# Patient Record
Sex: Female | Born: 1955
Health system: Southern US, Community
[De-identification: ages and names within clinical notes are randomized; demographics above are authoritative.]

## PROBLEM LIST (undated history)

## (undated) DIAGNOSIS — H4311 Vitreous hemorrhage, right eye: Secondary | ICD-10-CM

## (undated) DIAGNOSIS — N183 Chronic kidney disease, stage 3 unspecified: Secondary | ICD-10-CM

## (undated) DIAGNOSIS — I701 Atherosclerosis of renal artery: Secondary | ICD-10-CM

## (undated) DIAGNOSIS — N289 Disorder of kidney and ureter, unspecified: Secondary | ICD-10-CM

## (undated) DIAGNOSIS — E119 Type 2 diabetes mellitus without complications: Secondary | ICD-10-CM

## (undated) DIAGNOSIS — H409 Unspecified glaucoma: Secondary | ICD-10-CM

## (undated) DIAGNOSIS — M2550 Pain in unspecified joint: Secondary | ICD-10-CM

## (undated) DIAGNOSIS — I10 Essential (primary) hypertension: Secondary | ICD-10-CM

## (undated) DIAGNOSIS — H547 Unspecified visual loss: Secondary | ICD-10-CM

## (undated) DIAGNOSIS — Z961 Presence of intraocular lens: Secondary | ICD-10-CM

## (undated) DIAGNOSIS — E785 Hyperlipidemia, unspecified: Secondary | ICD-10-CM

## (undated) HISTORY — PX: CARDIAC CATHETERIZATION: SHX172

## (undated) HISTORY — PX: EYE SURGERY: SHX253

## (undated) HISTORY — PX: RENAL ARTERY STENT: SHX2321

## (undated) HISTORY — DX: Vitreous hemorrhage, right eye: H43.11

## (undated) HISTORY — DX: Type 2 diabetes mellitus without complications: E11.9

## (undated) HISTORY — PX: CATARACT EXTRACTION: SUR2

## (undated) HISTORY — PX: TONSILLECTOMY: SUR1361

## (undated) HISTORY — PX: CAROTID ENDARTERECTOMY: SUR193

## (undated) HISTORY — DX: Atherosclerosis of renal artery: I70.1

## (undated) HISTORY — DX: Unspecified glaucoma: H40.9

## (undated) HISTORY — DX: Presence of intraocular lens: Z96.1

---

## 2010-10-04 ENCOUNTER — Encounter: Admission: RE | Admit: 2010-10-04 | Discharge: 2010-10-04 | Payer: Self-pay | Admitting: Cardiology

## 2010-10-05 ENCOUNTER — Encounter: Admission: RE | Admit: 2010-10-05 | Discharge: 2010-10-05 | Payer: Self-pay | Admitting: Cardiology

## 2010-10-23 ENCOUNTER — Ambulatory Visit: Payer: Self-pay | Admitting: Vascular Surgery

## 2010-11-05 ENCOUNTER — Inpatient Hospital Stay (HOSPITAL_COMMUNITY)
Admission: RE | Admit: 2010-11-05 | Discharge: 2010-11-06 | Payer: Self-pay | Source: Home / Self Care | Attending: Vascular Surgery | Admitting: Vascular Surgery

## 2010-11-05 ENCOUNTER — Encounter: Payer: Self-pay | Admitting: Vascular Surgery

## 2010-11-27 ENCOUNTER — Ambulatory Visit
Admission: RE | Admit: 2010-11-27 | Discharge: 2010-11-27 | Payer: Self-pay | Source: Home / Self Care | Attending: Vascular Surgery | Admitting: Vascular Surgery

## 2010-11-29 ENCOUNTER — Observation Stay (HOSPITAL_COMMUNITY)
Admission: AD | Admit: 2010-11-29 | Discharge: 2010-11-30 | Payer: Self-pay | Source: Home / Self Care | Attending: Cardiology | Admitting: Cardiology

## 2010-11-29 LAB — GLUCOSE, CAPILLARY
Glucose-Capillary: 109 mg/dL — ABNORMAL HIGH (ref 70–99)
Glucose-Capillary: 136 mg/dL — ABNORMAL HIGH (ref 70–99)
Glucose-Capillary: 142 mg/dL — ABNORMAL HIGH (ref 70–99)
Glucose-Capillary: 384 mg/dL — ABNORMAL HIGH (ref 70–99)
Glucose-Capillary: 136 mg/dL — ABNORMAL HIGH (ref 70–99)
Glucose-Capillary: 255 mg/dL — ABNORMAL HIGH (ref 70–99)

## 2010-11-29 LAB — CBC
HCT: 34.2 % — ABNORMAL LOW (ref 36.0–46.0)
Hemoglobin: 11.3 g/dL — ABNORMAL LOW (ref 12.0–15.0)
MCH: 29.7 pg (ref 26.0–34.0)
MCHC: 33 g/dL (ref 30.0–36.0)
MCV: 89.8 fL (ref 78.0–100.0)
Platelets: 328 10*3/uL (ref 150–400)
RBC: 3.81 MIL/uL — ABNORMAL LOW (ref 3.87–5.11)
RDW: 13 % (ref 11.5–15.5)
WBC: 5.9 10*3/uL (ref 4.0–10.5)

## 2010-11-29 LAB — PROTIME-INR
INR: 0.89 (ref 0.00–1.49)
Prothrombin Time: 12.2 seconds (ref 11.6–15.2)

## 2010-11-30 DIAGNOSIS — I701 Atherosclerosis of renal artery: Secondary | ICD-10-CM

## 2010-11-30 LAB — CBC
HCT: 32.3 % — ABNORMAL LOW (ref 36.0–46.0)
Hemoglobin: 10.5 g/dL — ABNORMAL LOW (ref 12.0–15.0)
MCH: 29.3 pg (ref 26.0–34.0)
MCHC: 32.5 g/dL (ref 30.0–36.0)
MCV: 90.2 fL (ref 78.0–100.0)
Platelets: 290 10*3/uL (ref 150–400)
RBC: 3.58 MIL/uL — ABNORMAL LOW (ref 3.87–5.11)
RDW: 13.1 % (ref 11.5–15.5)
WBC: 5.3 10*3/uL (ref 4.0–10.5)

## 2010-11-30 LAB — BASIC METABOLIC PANEL
BUN: 13 mg/dL (ref 6–23)
CO2: 25 mEq/L (ref 19–32)
Calcium: 10.1 mg/dL (ref 8.4–10.5)
Chloride: 103 mEq/L (ref 96–112)
Creatinine, Ser: 0.72 mg/dL (ref 0.4–1.2)
GFR calc Af Amer: >60 mL/min (ref >60)
GFR calc non Af Amer: >60 mL/min (ref >60)
Glucose, Bld: 131 mg/dL — ABNORMAL HIGH (ref 70–99)
Potassium: 3.9 mEq/L (ref 3.5–5.1)
Sodium: 140 mEq/L (ref 135–145)

## 2010-11-30 LAB — GLUCOSE, CAPILLARY: Glucose-Capillary: 174 mg/dL — ABNORMAL HIGH (ref 70–99)

## 2011-02-05 LAB — COMPREHENSIVE METABOLIC PANEL
ALT: 24 U/L (ref 0–35)
AST: 30 U/L (ref 0–37)
Albumin: 4 g/dL (ref 3.5–5.2)
Alkaline Phosphatase: 75 U/L (ref 39–117)
BUN: 25 mg/dL — ABNORMAL HIGH (ref 6–23)
CO2: 31 mEq/L (ref 19–32)
Calcium: 10.3 mg/dL (ref 8.4–10.5)
Chloride: 103 mEq/L (ref 96–112)
Creatinine, Ser: 0.83 mg/dL (ref 0.4–1.2)
GFR calc Af Amer: >60 mL/min (ref >60)
GFR calc non Af Amer: >60 mL/min (ref >60)
Glucose, Bld: 75 mg/dL (ref 70–99)
Potassium: 4.7 mEq/L (ref 3.5–5.1)
Sodium: 140 mEq/L (ref 135–145)
Total Bilirubin: 0.5 mg/dL (ref 0.3–1.2)
Total Protein: 7.2 g/dL (ref 6.0–8.3)

## 2011-02-05 LAB — PROTIME-INR
INR: 0.97 (ref 0.00–1.49)
Prothrombin Time: 13.1 seconds (ref 11.6–15.2)

## 2011-02-05 LAB — CBC
HCT: 29 % — ABNORMAL LOW (ref 36.0–46.0)
HCT: 35.1 % — ABNORMAL LOW (ref 36.0–46.0)
Hemoglobin: 11.5 g/dL — ABNORMAL LOW (ref 12.0–15.0)
Hemoglobin: 9.6 g/dL — ABNORMAL LOW (ref 12.0–15.0)
MCH: 29.2 pg (ref 26.0–34.0)
MCH: 29.3 pg (ref 26.0–34.0)
MCHC: 32.8 g/dL (ref 30.0–36.0)
MCHC: 33.1 g/dL (ref 30.0–36.0)
MCV: 88.4 fL (ref 78.0–100.0)
MCV: 89.1 fL (ref 78.0–100.0)
Platelets: 278 10*3/uL (ref 150–400)
RBC: 3.94 MIL/uL (ref 3.87–5.11)
RDW: 12.5 % (ref 11.5–15.5)
WBC: 7.9 10*3/uL (ref 4.0–10.5)

## 2011-02-05 LAB — URINALYSIS, ROUTINE W REFLEX MICROSCOPIC
Bilirubin Urine: NEGATIVE
Glucose, UA: NEGATIVE mg/dL
Hgb urine dipstick: NEGATIVE
Ketones, ur: NEGATIVE mg/dL
Nitrite: NEGATIVE
Protein, ur: NEGATIVE mg/dL
Specific Gravity, Urine: 1.017 (ref 1.005–1.030)
Urobilinogen, UA: 1 mg/dL (ref 0.0–1.0)
pH: 7 (ref 5.0–8.0)

## 2011-02-05 LAB — GLUCOSE, CAPILLARY
Glucose-Capillary: 115 mg/dL — ABNORMAL HIGH (ref 70–99)
Glucose-Capillary: 170 mg/dL — ABNORMAL HIGH (ref 70–99)
Glucose-Capillary: 210 mg/dL — ABNORMAL HIGH (ref 70–99)
Glucose-Capillary: 235 mg/dL — ABNORMAL HIGH (ref 70–99)
Glucose-Capillary: 49 mg/dL — ABNORMAL LOW (ref 70–99)

## 2011-02-05 LAB — ABO/RH: ABO/RH(D): B NEG

## 2011-02-05 LAB — TYPE AND SCREEN
ABO/RH(D): B NEG
Antibody Screen: NEGATIVE

## 2011-02-05 LAB — BASIC METABOLIC PANEL
CO2: 28 mEq/L (ref 19–32)
Glucose, Bld: 159 mg/dL — ABNORMAL HIGH (ref 70–99)
Potassium: 3.6 mEq/L (ref 3.5–5.1)
Sodium: 134 mEq/L — ABNORMAL LOW (ref 135–145)

## 2011-02-05 LAB — SURGICAL PCR SCREEN
MRSA, PCR: NEGATIVE
Staphylococcus aureus: NEGATIVE

## 2011-02-05 LAB — APTT: aPTT: 25 seconds (ref 24–37)

## 2011-04-09 NOTE — Assessment & Plan Note (Signed)
OFFICE VISIT   Andrea Oconnell, Andrea Oconnell  DOB:  May 24, 1956                                       11/27/2010  CHART#:[id number]   Patient presents today for follow-up after right carotid endarterectomy  on 11/05/10.  She did well and was discharged home on postoperative day  #1.  She has mild numbness around the incision and no incisional  soreness.  She does have the usual thickening from the healing ridge.  Her incision is well-healed.  She has had no neurologic deficits.   She informs me that Dr. Jacinto Halim has requested to perform her carotid  duplex in 6 months' follow-up.  I feel that this is fine.  I will see  her in 6 months for final follow-up to assure complete healing.  She  will call us should she have any neurologic deficits.     Larina Earthly, M.D.  Electronically Signed   TFE/MEDQ  D:  11/27/2010  T:  11/27/2010  Job:  [id number]   cc:   Cristy Hilts. Jacinto Halim, MD

## 2011-04-09 NOTE — Consult Note (Signed)
NEW PATIENT CONSULTATION   Oconnell, Andrea Oconnell  DOB:  1956-07-16                                       10/23/2010  CHART#:[id number]   Andrea Oconnell Oconnell presents today for evaluation of severe asymptomatic right  internal carotid artery stenosis.  She is a very pleasant 55 year old  black female who was found by Dr. Jacinto Halim on physical examination to have  a right carotid bruit.  She underwent a carotid duplex on 10/27 showing  severe right internal carotid artery stenosis and subsequently underwent  a CT angiogram at [facility] on October 04, 2010 showing again  severe stenosis of her right internal carotid artery.  She has had  occlusion of the right vertebral artery and very minimal disease in her  left vertebral and carotid artery.  She specifically denies any prior  amaurosis fugax, transient ischemic attack or stroke.  She does not have  any history of cardiac disease.  She does have history of elevated blood  pressure, elevated cholesterol and non-insulin diabetes.   PAST SURGICAL HISTORY:  None.   SOCIAL HISTORY:  She is single with 2 children.  She works in  housekeeping at [employer].  She does not smoke,  having smoked in the remote past.  Does not drink alcohol.   FAMILY HISTORY:  Negative for premature atherosclerotic disease.   REVIEW OF SYSTEMS:  No weight loss or gain.  She is 127 pounds.  She is  5 feet 7 inches tall.  Vascular, cardiac, GI is negative.  NEUROLOGIC:  Positive for headache.  PULMONARY:  Positive for bronchitis.  Hematology, GU is negative.  ENT:  Does report some change in her eyesight.  Musculoskeletal, psychiatric and skin are negative.   PHYSICAL EXAMINATION:  Well-developed, well-nourished black female  appearing her stated age in no acute distress.  Blood pressure is 159/85 right arm, 170/82 left arm, heart rate 59,  respirations 16.  She is in no acute distress.  HEENT:  Normal.  She has a harsh right  carotid bruit and a soft left carotid bruit.  Her  radial pulses are 2+.  She has 2+ femoral, popliteal and 2+ posterior  tibial pulses.  HEART:  Regular rate and rhythm without murmur.  ABDOMEN:  Soft, nontender.  I do not feel any masses.  MUSCULOSKELETAL:  Shows no major deformities or cyanosis.  NEUROLOGIC:  No focal weakness or paresthesias.  SKIN:  Without ulcers or rashes.   I have reviewed her ultrasound and CT with [name] and her cousin  present.  I explained the significance of her critical stenosis in the  right internal carotid artery and have recommended endarterectomy for  reduction of stroke risk.  I explained the procedure including the  expected one 1 day hospitalization.  I did explain the 1-2% risk of  stroke with surgery.  She understands and wishes to proceed with  surgery.  We have scheduled this at her convenience on 12/12 at [facility].  She will notify us should she develop any neurologic  deficits in the interim.     Larina Earthly, M.D.  Electronically Signed   TFE/MEDQ  D:  10/23/2010  T:  10/24/2010  Job:  [id number]   cc:   Jackie Plum, M.D.

## 2011-04-09 NOTE — Procedures (Signed)
NAMEMAIZE, BRITTINGHAM NO.:  [id number]   MEDICAL RECORD NO.:  [id number]          PATIENT TYPE:  INP   LOCATION:  6523                         FACILITY:  [facility]   PHYSICIAN:  Cristy Hilts. Jacinto Halim, MD       DATE OF BIRTH:  Feb 16, 1956   DATE OF PROCEDURE:  11/29/2010  DATE OF DISCHARGE:                    PERIPHERAL VASCULAR INVASIVE PROCEDURE    PROCEDURE PERFORMED:  1. Abdominal aortogram.  2. Selective right and left renal arteriography.  3. Percutaneous transluminal angioplasty and balloon angioplasty of      the inferior right renal artery with a 5.5 x 20 mm Aviator balloon.   INDICATIONS:  Ms. Leslie Jester is a 55 year old hypertensive diabetic  female who has difficult to control hypertension and presently on  greater than 4 drugs.  In spite of this, her systolic blood pressure is  greater than 200, diastolic blood pressure greater than 100 to 110 mmHg.  Given this, she had undergone renal duplex evaluation.  She had  undergone abdominal CT angiogram and this had revealed fibromuscular  dysplasia for right renal artery and normal left renal artery.  Given  this, she was now brought to the catheterization lab for definitive  diagnosis of fibromuscular dysplasia and possible angioplasty of the  same.   Abdominal aortogram.  Abdominal aortogram revealed smooth abdominal  aorta without any abdominal aortic aneurysm.  Aortoiliac bifurcation was  widely patent.  There were 2 renal arteries of the right and 2 renal  arteries of the left, which were almost equal; however, the inferior  pole renal arteries were much larger than the superior pole bilaterally.   Surprisingly, there was only one right renal inferior pole artery that  showed fibromuscular dysplasia appearance of beading.   HEMODYNAMIC DATA:  A careful 4-French end-hole catheter pullback was  performed across the FMD lesion.  There was about 20-25 mm pressure  gradient across the FMD documented.   INTERVENTION DATA:  Successful balloon angioplasty of the right inferior  renal artery FMD with a 5.5 x 20 mm Aviator balloon at a peak of 8  atmospheric pressure for 30 seconds followed by angiography.  Excellent  results were noted.   HEMODYNAMIC DATA:  Postangioplasty, careful 4-French indwelling catheter  pullback was again performed after the angioplasty to confirm adequacy  of angioplasty.  This did confirm adequate angioplasty without any  residual pressure gradient across the FMD.  Hence, the pressure was  deemed successful at the catheter and the guidewires were withdrawn out  of the body.   TECHNIQUE OF THE PROCEDURE:  Under sterile precautions, using a 6-French  right femoral arterial access, a 5-French short LIMA catheter was  advanced into the abdominal aorta and abdominal aortogram was performed.  Selective right and left renal arteriography was performed only at the  inferior pole arteries and the superior pole arteries appeared to be  totally normal.  There was beaded appearance evident at the right renal  artery.  Given heparin for anticoagulation maintaining ACT greater than  200, a Stabilizer guidewire was advanced into right renal artery.  A 4-  French end-hole catheter was  then advanced into the FMD and careful  pullback was performed with documentation of pressure gradient.  This  was followed by utilization of a 6-French LIMA guide catheter and  engagement of the inferior renal pole artery and performing the  angioplasty over the Stabilizer guidewire with utilization of a 5.5 x 20  mm Aviator balloon.  One inflation was performed at a peak of 8  atmospheric pressure for a total of 30 seconds.  The patient did have  mild loin pain during the angioplasty, hence the pressure was slowly  withdrawn and then angiography was performed.  Excellent results were  noted.  This was confirmed with a pullback, 4-French catheter  utilization for hemodynamic monitoring followed  by withdrawal of the  guidewire and guide catheter were outside of the body.  Right femoral  arteriography was performed through the arterial access sheath and the  access was closed with ProGlide Perclose with excellent hemostasis.  The  patient tolerated the procedure.  There was no immediate complication  noted.      Cristy Hilts. Jacinto Halim, MD      JRG/MEDQ  D:  11/29/2010  T:  11/29/2010  Job:  [id number]   cc:   Jackie Plum, M.D.   Electronically Signed by Yates Decamp MD on 12/01/2010 10:25:16 AM

## 2011-06-04 ENCOUNTER — Other Ambulatory Visit (INDEPENDENT_AMBULATORY_CARE_PROVIDER_SITE_OTHER): Payer: Commercial Managed Care - PPO

## 2011-06-04 ENCOUNTER — Ambulatory Visit (INDEPENDENT_AMBULATORY_CARE_PROVIDER_SITE_OTHER): Payer: Commercial Managed Care - PPO | Admitting: Vascular Surgery

## 2011-06-04 DIAGNOSIS — Z48812 Encounter for surgical aftercare following surgery on the circulatory system: Secondary | ICD-10-CM

## 2011-06-04 DIAGNOSIS — I6529 Occlusion and stenosis of unspecified carotid artery: Secondary | ICD-10-CM

## 2011-06-04 NOTE — Assessment & Plan Note (Signed)
OFFICE VISIT  AIYA, KEACH DOB:  [birth year]                                       06/04/2011 CHART#:[id number]  Patient presents today for follow-up of her right carotid endarterectomy, Dacron patch angioplasty on 11/05/10.  She continues to do quite well.  She denies any new cardiac or neurologic difficulties. She continues to be a nonsmoker.  She does have diabetes, hypertension, and elevated cholesterol.  Review of systems is positive only for bronchitis other than her history of present illness.  PHYSICAL EXAMINATION:  A well-developed and well-nourished black female appearing stated age in no acute stress.  Blood pressure is 120/70 right arm, 110/64 left arm.  Heart rate is 59, respirations 14.  HEENT: Normal.  Her right neck incision is well-healed without tenderness.  She does have 2+ radial pulses bilaterally.  She is grossly intact neurologically.  She underwent repeat carotid duplex in our office.  This reveals widely patent endarterectomy with no evidence of stenosis and no evidence of stenosis in her contralateral left carotid system.  I am quite pleased with patient's result.  Plan to see her again in 1 year with continued duplex follow-up.    Larina Earthly, M.D. Electronically Signed  TFE/MEDQ  D:  06/04/2011  T:  06/04/2011  Job:  [id number]  cc:   Pamella Pert, MD

## 2011-06-12 NOTE — Procedures (Unsigned)
CAROTID DUPLEX EXAM  INDICATION:  Followup carotid stenosis.  HISTORY: Diabetes:  Yes. Cardiac:  No. Hypertension:  Yes. Smoking:  No. Previous Surgery:  Right carotid endarterectomy 11/05/2010. CV History:  Asymptomatic. Amaurosis Fugax No, Paresthesias No, Hemiparesis No                                      RIGHT             LEFT Brachial systolic pressure:         148               142 Brachial Doppler waveforms:         WNL               WNL Vertebral direction of flow:        Antegrade/blunted Antegrade DUPLEX VELOCITIES (cm/sec) CCA peak systolic                   67                104 ECA peak systolic                   109               86 ICA peak systolic                   65                79 ICA end diastolic                   28                27 PLAQUE MORPHOLOGY:                  Heterogeneous     Heterogeneous PLAQUE AMOUNT:                      Mild              Mild PLAQUE LOCATION:                    CCA/ECA           CCA/ICA  IMPRESSION: 1. Right internal carotid artery patent with history of endarterectomy     on 11/05/2010. 2. Left internal carotid artery stenosis in the 1%-39% range. 3. Abnormal blunted low velocity flow in the right vertebral artery     with normal multiphasic Doppler signals in the right subclavian and     innominate artery. 4. Left vertebral artery is patent and antegrade.  ___________________________________________ Larina Earthly, M.D.  SH/MEDQ  D:  06/04/2011  T:  06/04/2011  Job:  [id number]

## 2011-09-11 DIAGNOSIS — I6529 Occlusion and stenosis of unspecified carotid artery: Secondary | ICD-10-CM

## 2011-12-12 ENCOUNTER — Other Ambulatory Visit: Payer: Commercial Managed Care - PPO

## 2011-12-12 ENCOUNTER — Other Ambulatory Visit: Payer: Medicaid Other

## 2012-01-09 ENCOUNTER — Other Ambulatory Visit (INDEPENDENT_AMBULATORY_CARE_PROVIDER_SITE_OTHER): Payer: Medicaid Other | Admitting: *Deleted

## 2012-01-09 DIAGNOSIS — Z48812 Encounter for surgical aftercare following surgery on the circulatory system: Secondary | ICD-10-CM

## 2012-01-09 DIAGNOSIS — I6529 Occlusion and stenosis of unspecified carotid artery: Secondary | ICD-10-CM

## 2012-01-15 ENCOUNTER — Encounter: Payer: Self-pay | Admitting: Vascular Surgery

## 2012-01-15 NOTE — Procedures (Unsigned)
CAROTID DUPLEX EXAM  INDICATION:  Followup carotid endarterectomy.  HISTORY: Diabetes:  Yes Cardiac:  No Hypertension:  Yes Smoking:  No Previous Surgery:  Right carotid endarterectomy 11/05/2010 CV History:  Currently asymptomatic Amaurosis Fugax No, Paresthesias No, Hemiparesis No                                      RIGHT             LEFT Brachial systolic pressure:         143               145 Brachial Doppler waveforms:         Normal            Normal Vertebral direction of flow:        Antegrade-tardus parvus             Antegrade DUPLEX VELOCITIES (cm/sec) CCA peak systolic                   56                92 ECA peak systolic                   175               75 ICA peak systolic                   73                91 ICA end diastolic                   22                32 PLAQUE MORPHOLOGY:                  Heterogenous      Heterogenous PLAQUE AMOUNT:                      Minimal           Minimal PLAQUE LOCATION:                    CCA, ECA          CCA, ICA  IMPRESSION:  Patent right carotid endarterectomy site with no evidence of restenosis.  Left ICA velocity suggests 1% to 39% stenosis.  Abnormal right vertebral artery flow.  Stable in comparison to the previous exam.  ___________________________________________ Larina Earthly, M.D.  EM/MEDQ  D:  01/10/2012  T:  01/10/2012  Job:  [id number]

## 2012-02-16 ENCOUNTER — Encounter (HOSPITAL_BASED_OUTPATIENT_CLINIC_OR_DEPARTMENT_OTHER): Payer: Self-pay

## 2012-02-16 ENCOUNTER — Emergency Department (INDEPENDENT_AMBULATORY_CARE_PROVIDER_SITE_OTHER): Payer: Medicaid Other

## 2012-02-16 ENCOUNTER — Other Ambulatory Visit: Payer: Self-pay

## 2012-02-16 ENCOUNTER — Emergency Department (HOSPITAL_BASED_OUTPATIENT_CLINIC_OR_DEPARTMENT_OTHER)
Admission: EM | Admit: 2012-02-16 | Discharge: 2012-02-16 | Disposition: A | Discharge status: Home / Self Care | Payer: Medicaid Other | Attending: Emergency Medicine | Admitting: Emergency Medicine

## 2012-02-16 DIAGNOSIS — M25519 Pain in unspecified shoulder: Secondary | ICD-10-CM | POA: Insufficient documentation

## 2012-02-16 DIAGNOSIS — M79609 Pain in unspecified limb: Secondary | ICD-10-CM

## 2012-02-16 DIAGNOSIS — R9431 Abnormal electrocardiogram [ECG] [EKG]: Secondary | ICD-10-CM | POA: Insufficient documentation

## 2012-02-16 DIAGNOSIS — I10 Essential (primary) hypertension: Secondary | ICD-10-CM | POA: Insufficient documentation

## 2012-02-16 DIAGNOSIS — M79603 Pain in arm, unspecified: Secondary | ICD-10-CM

## 2012-02-16 DIAGNOSIS — M542 Cervicalgia: Secondary | ICD-10-CM

## 2012-02-16 DIAGNOSIS — M436 Torticollis: Secondary | ICD-10-CM

## 2012-02-16 DIAGNOSIS — E119 Type 2 diabetes mellitus without complications: Secondary | ICD-10-CM | POA: Insufficient documentation

## 2012-02-16 DIAGNOSIS — Z7982 Long term (current) use of aspirin: Secondary | ICD-10-CM | POA: Insufficient documentation

## 2012-02-16 DIAGNOSIS — M47812 Spondylosis without myelopathy or radiculopathy, cervical region: Secondary | ICD-10-CM

## 2012-02-16 DIAGNOSIS — Z794 Long term (current) use of insulin: Secondary | ICD-10-CM | POA: Insufficient documentation

## 2012-02-16 DIAGNOSIS — Z79899 Other long term (current) drug therapy: Secondary | ICD-10-CM | POA: Insufficient documentation

## 2012-02-16 DIAGNOSIS — E785 Hyperlipidemia, unspecified: Secondary | ICD-10-CM | POA: Insufficient documentation

## 2012-02-16 HISTORY — DX: Essential (primary) hypertension: I10

## 2012-02-16 HISTORY — DX: Hyperlipidemia, unspecified: E78.5

## 2012-02-16 LAB — DIFFERENTIAL
Eosinophils Relative: 4 % (ref 0–5)
Lymphocytes Relative: 35 % (ref 12–46)
Lymphs Abs: 2 10*3/uL (ref 0.7–4.0)
Monocytes Absolute: 0.6 10*3/uL (ref 0.1–1.0)

## 2012-02-16 LAB — CBC
HCT: 38.1 % (ref 36.0–46.0)
Hemoglobin: 12.9 g/dL (ref 12.0–15.0)
MCV: 85.6 fL (ref 78.0–100.0)
Platelets: 281 10*3/uL (ref 150–400)
RBC: 4.45 MIL/uL (ref 3.87–5.11)
WBC: 5.9 10*3/uL (ref 4.0–10.5)

## 2012-02-16 LAB — CK TOTAL AND CKMB (NOT AT ARMC): CK, MB: 2 ng/mL (ref 0.3–4.0)

## 2012-02-16 LAB — BASIC METABOLIC PANEL
CO2: 28 mEq/L (ref 19–32)
Calcium: 11.1 mg/dL — ABNORMAL HIGH (ref 8.4–10.5)
Glucose, Bld: 316 mg/dL — ABNORMAL HIGH (ref 70–99)
Sodium: 137 mEq/L (ref 135–145)

## 2012-02-16 MED ORDER — CYCLOBENZAPRINE HCL 10 MG PO TABS
5.0000 mg | ORAL_TABLET | Freq: Once | ORAL | Status: AC
  Administered 2012-02-16: 5 mg via ORAL
  Filled 2012-02-16: qty 1

## 2012-02-16 MED ORDER — OXYCODONE-ACETAMINOPHEN 5-325 MG PO TABS
2.0000 | ORAL_TABLET | Freq: Once | ORAL | Status: AC
  Administered 2012-02-16: 2 via ORAL
  Filled 2012-02-16: qty 2

## 2012-02-16 MED ORDER — SODIUM CHLORIDE 0.9 % IV BOLUS (SEPSIS)
1000.0000 mL | Freq: Once | INTRAVENOUS | Status: AC
  Administered 2012-02-16: 1000 mL via INTRAVENOUS

## 2012-02-16 MED ORDER — NITROGLYCERIN 0.4 MG SL SUBL
0.4000 mg | SUBLINGUAL_TABLET | SUBLINGUAL | Status: DC | PRN
  Administered 2012-02-16 (×2): 0.4 mg via SUBLINGUAL
  Filled 2012-02-16: qty 25

## 2012-02-16 MED ORDER — HYDROMORPHONE HCL PF 1 MG/ML IJ SOLN
1.0000 mg | Freq: Once | INTRAMUSCULAR | Status: AC
  Administered 2012-02-16: 1 mg via INTRAVENOUS
  Filled 2012-02-16: qty 1

## 2012-02-16 MED ORDER — SODIUM CHLORIDE 0.9 % IV SOLN
INTRAVENOUS | Status: DC
  Administered 2012-02-16: 11:00:00 via INTRAVENOUS

## 2012-02-16 NOTE — ED Notes (Signed)
Pt is hypotensive.  IVF increases.  Pt skin warm and dry.

## 2012-02-16 NOTE — Discharge Instructions (Signed)
Please call Dr. Verl Dicker office at 8 8:15 in the morning to be rechecked tomorrow. Please return if you're worse at any time.

## 2012-02-16 NOTE — ED Notes (Signed)
EKG done and shown to Dr. Rosalia Hammers. Old EKG pulled from the muse system and also shown to the doctor.

## 2012-02-16 NOTE — ED Provider Notes (Addendum)
History     CSN: [id number]  Arrival date & time 02/16/12  1191   First MD Initiated Contact with Patient 02/16/12 [phone]      Chief Complaint  Patient presents with  . Shoulder Pain  . Torticollis    (Consider location/radiation/quality/duration/timing/severity/associated sxs/prior treatment) HPI Patient presents complaining of left upper arm pain which began yesterday. She states that it waxed and waned yesterday and is worse today. She has not had any known injury. She points to the upper arm area. She states it is worse when she twists her head. She denies neck pain, chest pain, cough, shortness of breath, leg swelling, or trauma. She has not had similar symptoms in the past. Pain is approximately 5/10 currently. Past Medical History  Diagnosis Date  . Diabetes mellitus   . Hypertension   . Hyperlipidemia     Past Surgical History  Procedure Date  . Carotid endarterectomy   . Renal artery stent   . Cataract extraction     No family history on file.  History  Substance Use Topics  . Smoking status: Never Smoker   . Smokeless tobacco: Not on file  . Alcohol Use: No    OB History    Grav Para Term Preterm Abortions TAB SAB Ect Mult Living                  Review of Systems  All other systems reviewed and are negative.    Allergies  Review of patient's allergies indicates no known allergies.  Home Medications   Current Outpatient Rx  Name Route Sig Dispense Refill  . AMLODIPINE BESYLATE 10 MG PO TABS Oral Take 10 mg by mouth daily.    . ASPIRIN 325 MG PO TABS Oral Take 325 mg by mouth daily.    Marland Kitchen GLIPIZIDE-METFORMIN HCL 5-500 MG PO TABS Oral Take 1 tablet by mouth 2 (two) times daily before a meal.    . HYDROCHLOROTHIAZIDE 25 MG PO TABS Oral Take 25 mg by mouth daily.    . INSULIN GLARGINE 100 UNIT/ML [place] SOLN Subcutaneous Inject 15 Units into the skin at bedtime.    . MULTIVITAMINS PO CAPS Oral Take 1 capsule by mouth daily.    . NEBIVOLOL HCL 10 MG PO  TABS Oral Take 10 mg by mouth daily.    Marland Kitchen ROSUVASTATIN CALCIUM 20 MG PO TABS Oral Take 20 mg by mouth at bedtime.      BP 131/67  Pulse 62  Temp(Src) 98.3 F (36.8 C) (Oral)  Resp 16  Ht 5\' 7"  (1.702 m)  Wt 140 lb (63.504 kg)  BMI 21.93 kg/m2  SpO2 99%  Physical Exam  Nursing note and vitals reviewed. Constitutional: She appears well-developed and well-nourished.  HENT:  Head: Normocephalic and atraumatic.  Eyes: Conjunctivae and EOM are normal. Pupils are equal, round, and reactive to light.  Neck: Normal range of motion. Neck supple.  Cardiovascular: Normal rate, regular rhythm, normal heart sounds and intact distal pulses.   Pulmonary/Chest: Effort normal and breath sounds normal.       No tenderness on palpation.  Abdominal: Soft. Bowel sounds are normal.  Musculoskeletal: Normal range of motion.       No tenderness on palpation or with shoulder movement. Full active range of motion of neck, shoulder elbow, and wrist. No pain with movement. Radial pulses are intact and sensation is intact throughout left upper extremity.  Neurological: She is alert.  Skin: Skin is warm and dry.  Psychiatric: She  has a normal mood and affect. Thought content normal.    ED Course  Procedures (including critical care time)  Labs Reviewed  BASIC METABOLIC PANEL - Abnormal; Notable for the following:    Glucose, Bld 316 (*)    BUN 28 (*)    Calcium 11.1 (*)    GFR calc non Af Amer 80 (*)    All other components within normal limits  TROPONIN I  CBC  DIFFERENTIAL  CK TOTAL AND CKMB  TROPONIN I   Dg Chest 2 View  02/16/2012  *RADIOLOGY REPORT*  Clinical Data: Left neck, shoulder, and arm pain.  CHEST - 2 VIEW  Comparison: None.  Findings: Midline trachea.  Normal heart size and mediastinal contours. No pleural effusion or pneumothorax.  Clear right lung. Probable nipple shadow projecting over the left lung base.  IMPRESSION:  1. No acute cardiopulmonary disease. 2.  Probable left sided  nipple shadow.  Repeat frontal film with nipple markers could confirm.  Original Report Authenticated By: Consuello Bossier, M.D.   Dg Cerv Spine Flex&ext Only  02/16/2012  *RADIOLOGY REPORT*  Clinical Data: Neck and shoulder pain without trauma.  CERVICAL SPINE - FLEXION AND EXTENSION VIEWS ONLY  Comparison: The CT of 10/04/2010  Findings: Lateral flexion and lateral extension views.  Lateral flexion view images through the C7 level.  Limited range of motion. Prevertebral soft tissues are within normal limits.  Maintenance of vertebral body height.  Spondylosis at C5-C6 and C6-C7.  The extension view also demonstrates limited range of motion.  No instability across this limited range of motion.  Vertebral body height is maintained.  The extension view images through C7 as well.  IMPRESSION: Lower cervical spondylosis.  Extremely limited range of motion. Maintenance of vertebral body heights.  Original Report Authenticated By: Consuello Bossier, M.D.   Dg Shoulder Left  02/16/2012  *RADIOLOGY REPORT*  Clinical Data: Onset of left neck and shoulder pain.  No trauma.  LEFT SHOULDER - 2+ VIEW  Comparison: None.  Findings: Minimal acromioclavicular joint osteoarthritis. No acute fracture or dislocation.  Visualized portions of the left hemithorax are within normal limits.  IMPRESSION: No acute osseous abnormality.  Original Report Authenticated By: Consuello Bossier, M.D.     No diagnosis found.   Date: 02/16/2012  Rate: 566  Rhythm: sinus tachycardia  QRS Axis: normal  Intervals: normal  ST/T Wave abnormalities: nonspecific ST/T changes  Conduction Disutrbances:anterior q waves  Narrative Interpretation:   Old EKG Reviewed: changes noted  Results for orders placed during the hospital encounter of 02/16/12  TROPONIN I      Component Value Range   Troponin I <0.30  <0.30 (ng/mL)  CBC      Component Value Range   WBC 5.9  4.0 - 10.5 (K/uL)   RBC 4.45  3.87 - 5.11 (MIL/uL)   Hemoglobin 12.9  12.0 -  15.0 (g/dL)   HCT 11.9  14.7 - 82.9 (%)   MCV 85.6  78.0 - 100.0 (fL)   MCH 29.0  26.0 - 34.0 (pg)   MCHC 33.9  30.0 - 36.0 (g/dL)   RDW 56.2  13.0 - 86.5 (%)   Platelets 281  150 - 400 (K/uL)  DIFFERENTIAL      Component Value Range   Neutrophils Relative 52  43 - 77 (%)   Neutro Abs 3.0  1.7 - 7.7 (K/uL)   Lymphocytes Relative 35  12 - 46 (%)   Lymphs Abs 2.0  0.7 - 4.0 (  K/uL)   Monocytes Relative 9  3 - 12 (%)   Monocytes Absolute 0.6  0.1 - 1.0 (K/uL)   Eosinophils Relative 4  0 - 5 (%)   Eosinophils Absolute 0.2  0.0 - 0.7 (K/uL)   Basophils Relative 1  0 - 1 (%)   Basophils Absolute 0.0  0.0 - 0.1 (K/uL)  BASIC METABOLIC PANEL      Component Value Range   Sodium 137  135 - 145 (mEq/L)   Potassium 3.9  3.5 - 5.1 (mEq/L)   Chloride 97  96 - 112 (mEq/L)   CO2 28  19 - 32 (mEq/L)   Glucose, Bld 316 (*) 70 - 99 (mg/dL)   BUN 28 (*) 6 - 23 (mg/dL)   Creatinine, Ser 4.40  0.50 - 1.10 (mg/dL)   Calcium 10.2 (*) 8.4 - 10.5 (mg/dL)   GFR calc non Af Amer 80 (*) >90 (mL/min)   GFR calc Af Amer >90  >90 (mL/min)     MDM  Patient given nitroglycerin x2 here. Pain decreased to 3/10 after first nitroglycerin but then increased to 8/10 after second nitroglycerin. She is given IV Dilaudid and pain has decreased again to 3/10. First troponin is negative. Second troponin and CK-MB are pending. Patient's care discussed with Dr. Jacinto Halim  he feels given ration of patient's symptoms, a second set of enzymes can be obtained and if these are negative, she can be seen in followup by him tomorrow. I have discussed this with the patient. She is in agreement. She will call his office at 8 AM tomorrow to be rechecked tomorrow. She understands the pain changes or mesenteric chest she should be reevaluated immediately.    Hilario Quarry, MD 02/16/12 1407  Patient had been discharged. I was called in to room to speak with family member. Family member states that she has "never seen somebody discharged  with an abnormal EKG." Family member states that patient is very weak. Patient states that she feels fine and thinks that maybe the pain medicine. Vital signs are rechecked and patient continues hypertensive. Discussed with patient and family member other possibilities of arm pain including radiculopathy. Discussed with patient and family member that new symptoms could certainly change plans. Patient states that she wants to go home and is comfortable following up with Dr. Jacinto Halim in the morning.  Hilario Quarry, MD 02/16/12 [phone]

## 2012-02-16 NOTE — ED Notes (Signed)
Patient transported to X-ray 

## 2012-02-16 NOTE — ED Notes (Signed)
Pt reports onset of left sided neck, shoulder and arm pain that started yesterday.  Denies injury.

## 2012-02-16 NOTE — ED Notes (Signed)
Pt transported to and from radiology. Family at bedside.

## 2013-01-14 ENCOUNTER — Ambulatory Visit: Payer: Medicaid Other | Admitting: Neurosurgery

## 2013-01-14 ENCOUNTER — Other Ambulatory Visit: Payer: Medicaid Other

## 2013-01-15 ENCOUNTER — Other Ambulatory Visit: Payer: Medicaid Other

## 2013-07-09 IMAGING — CR DG SHOULDER 2+V*L*
3 series · 3 of 3 positions shown · non-contrast
Comparison: None.

CLINICAL DATA: Onset of left neck and shoulder pain.  No trauma.

LEFT SHOULDER - 2+ VIEW

[w shoulder ap internal left]
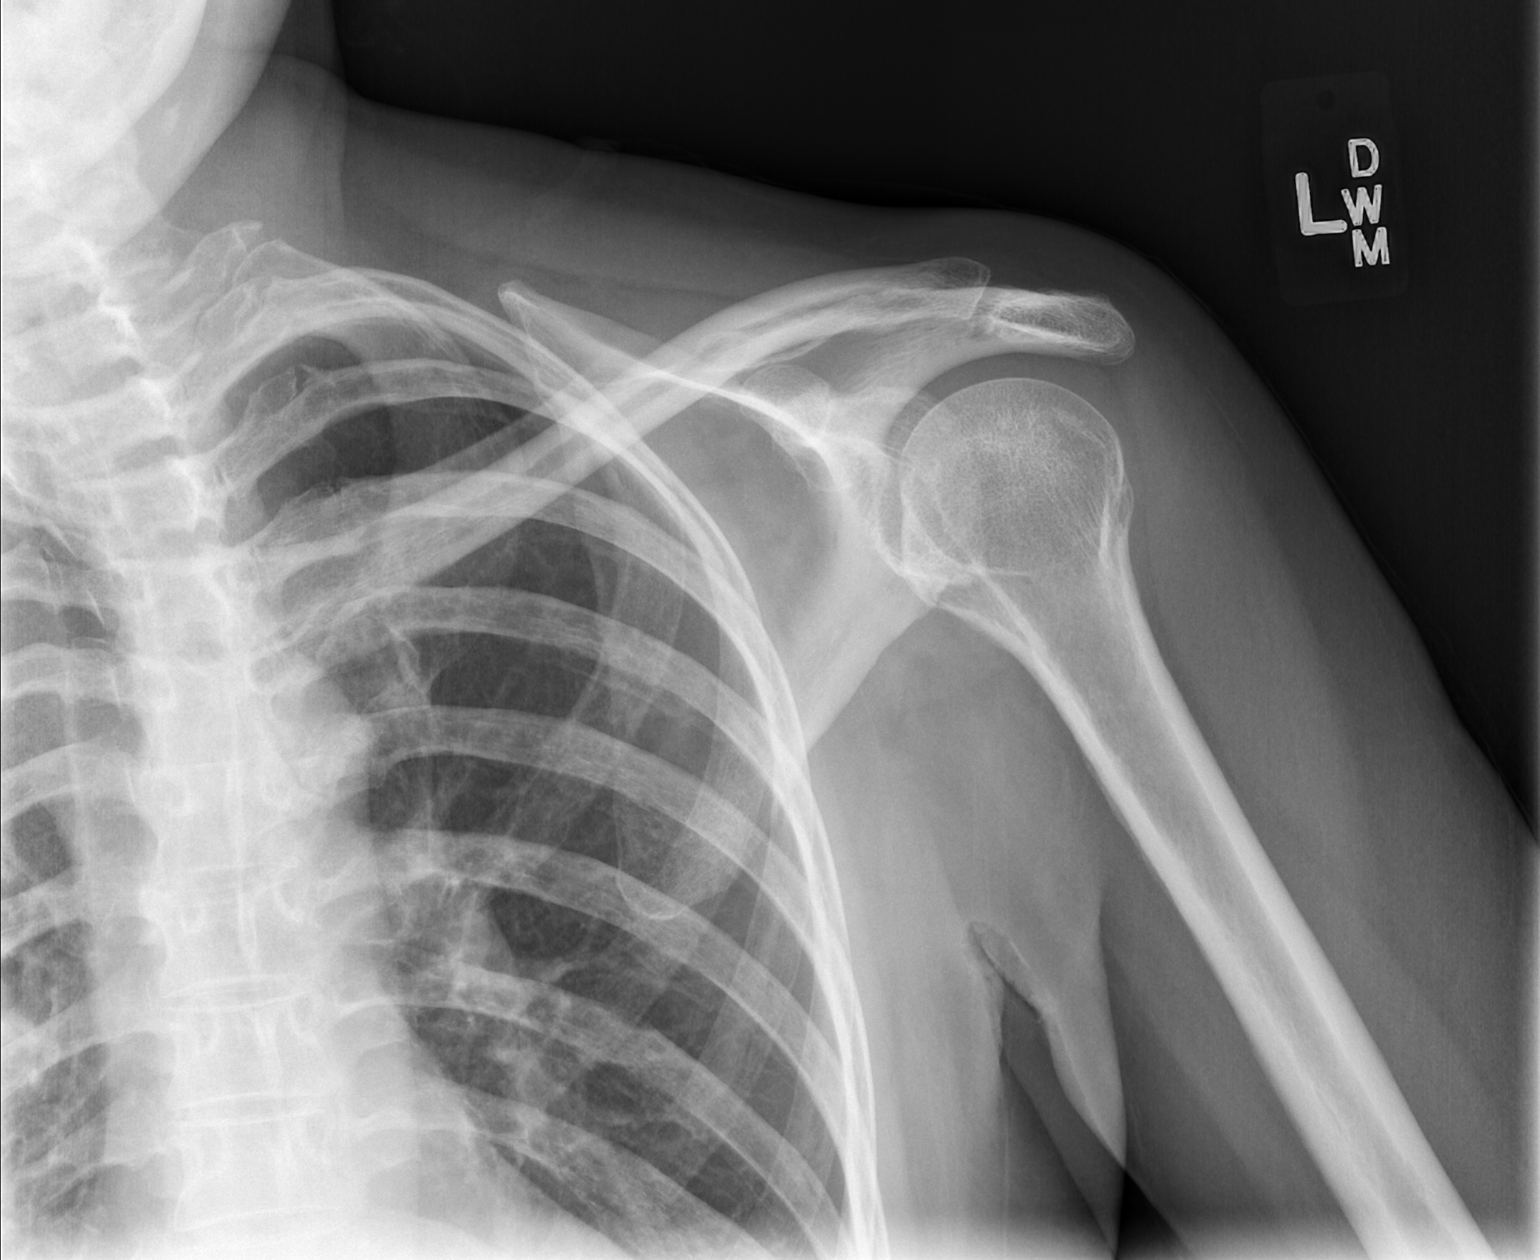

[w shoulder ap external left]
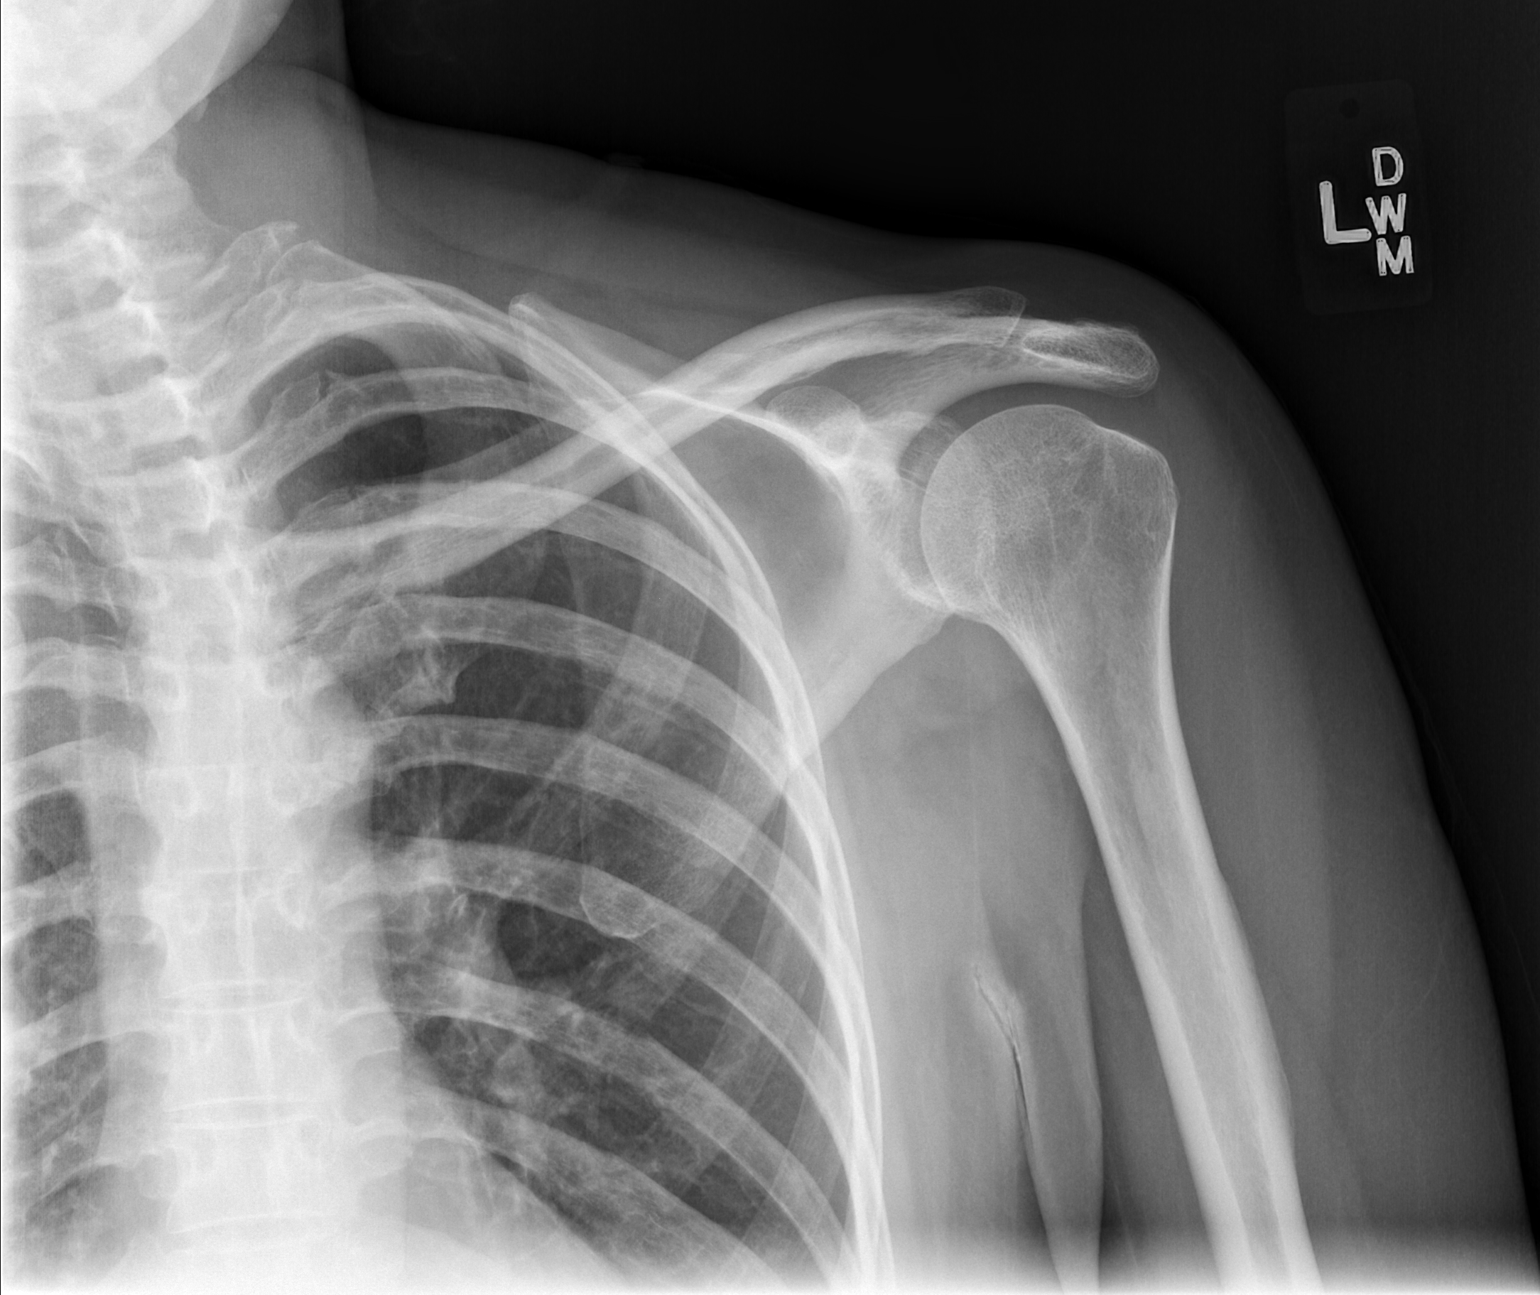

[w shoulder y view left]
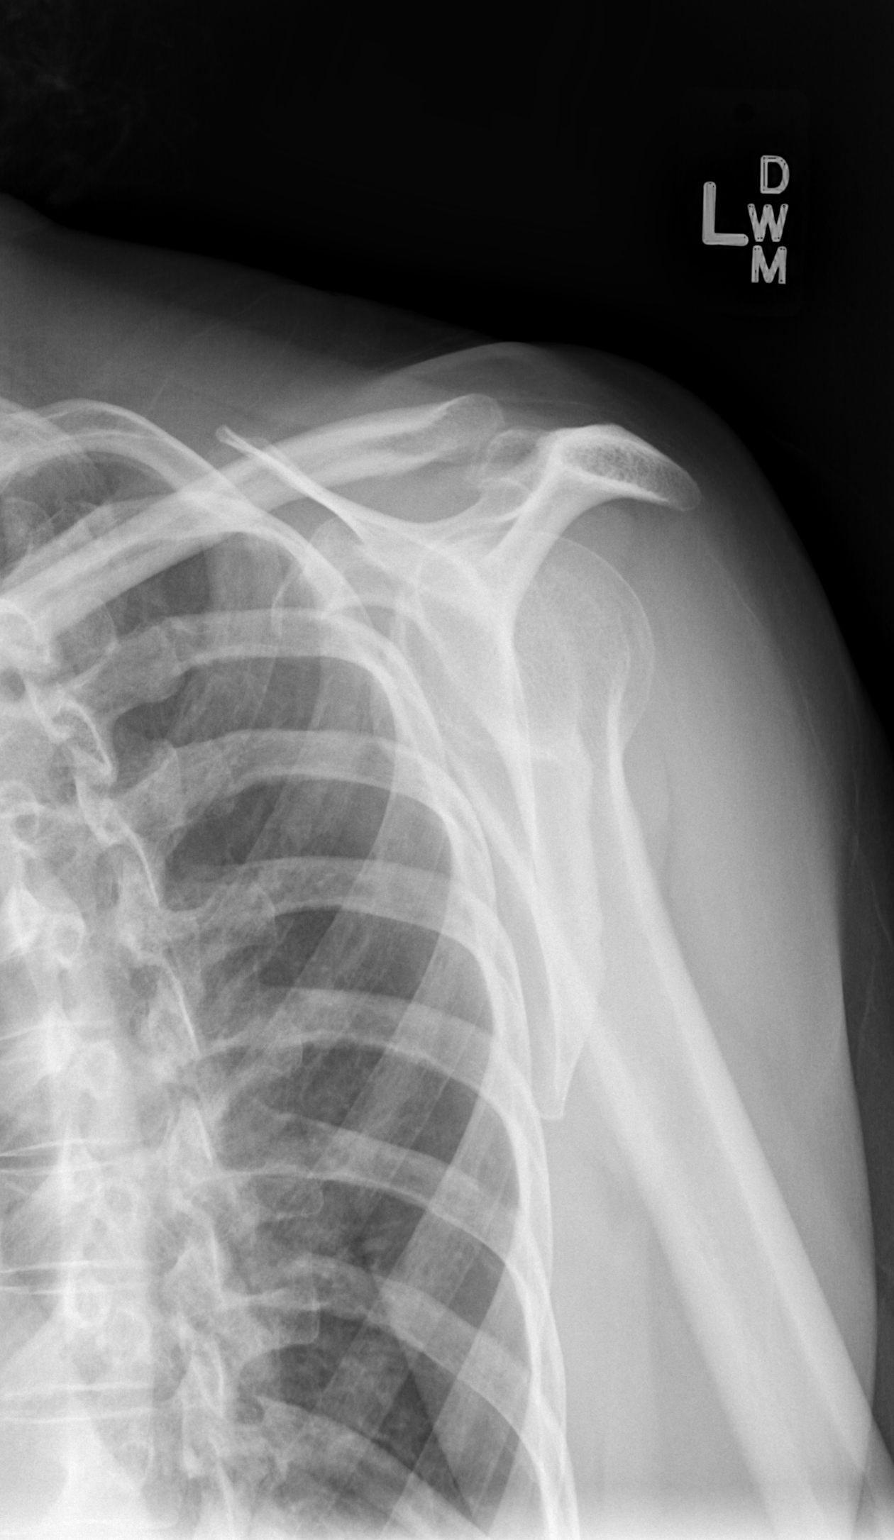

[3 of 3 positions shown; findings below may reference images not displayed]

FINDINGS: Minimal acromioclavicular joint osteoarthritis. No acute
fracture or dislocation.  Visualized portions of the left
hemithorax are within normal limits.
IMPRESSION: No acute osseous abnormality.

## 2013-07-09 IMAGING — CR DG CERVICAL SPINE FLEX&EXT ONLY
2 series · 2 of 2 positions shown · non-contrast
Comparison: The CT of 10/04/2010

CLINICAL DATA: Neck and shoulder pain without trauma.

CERVICAL SPINE - FLEXION AND EXTENSION VIEWS ONLY

[w c-spine flexion]
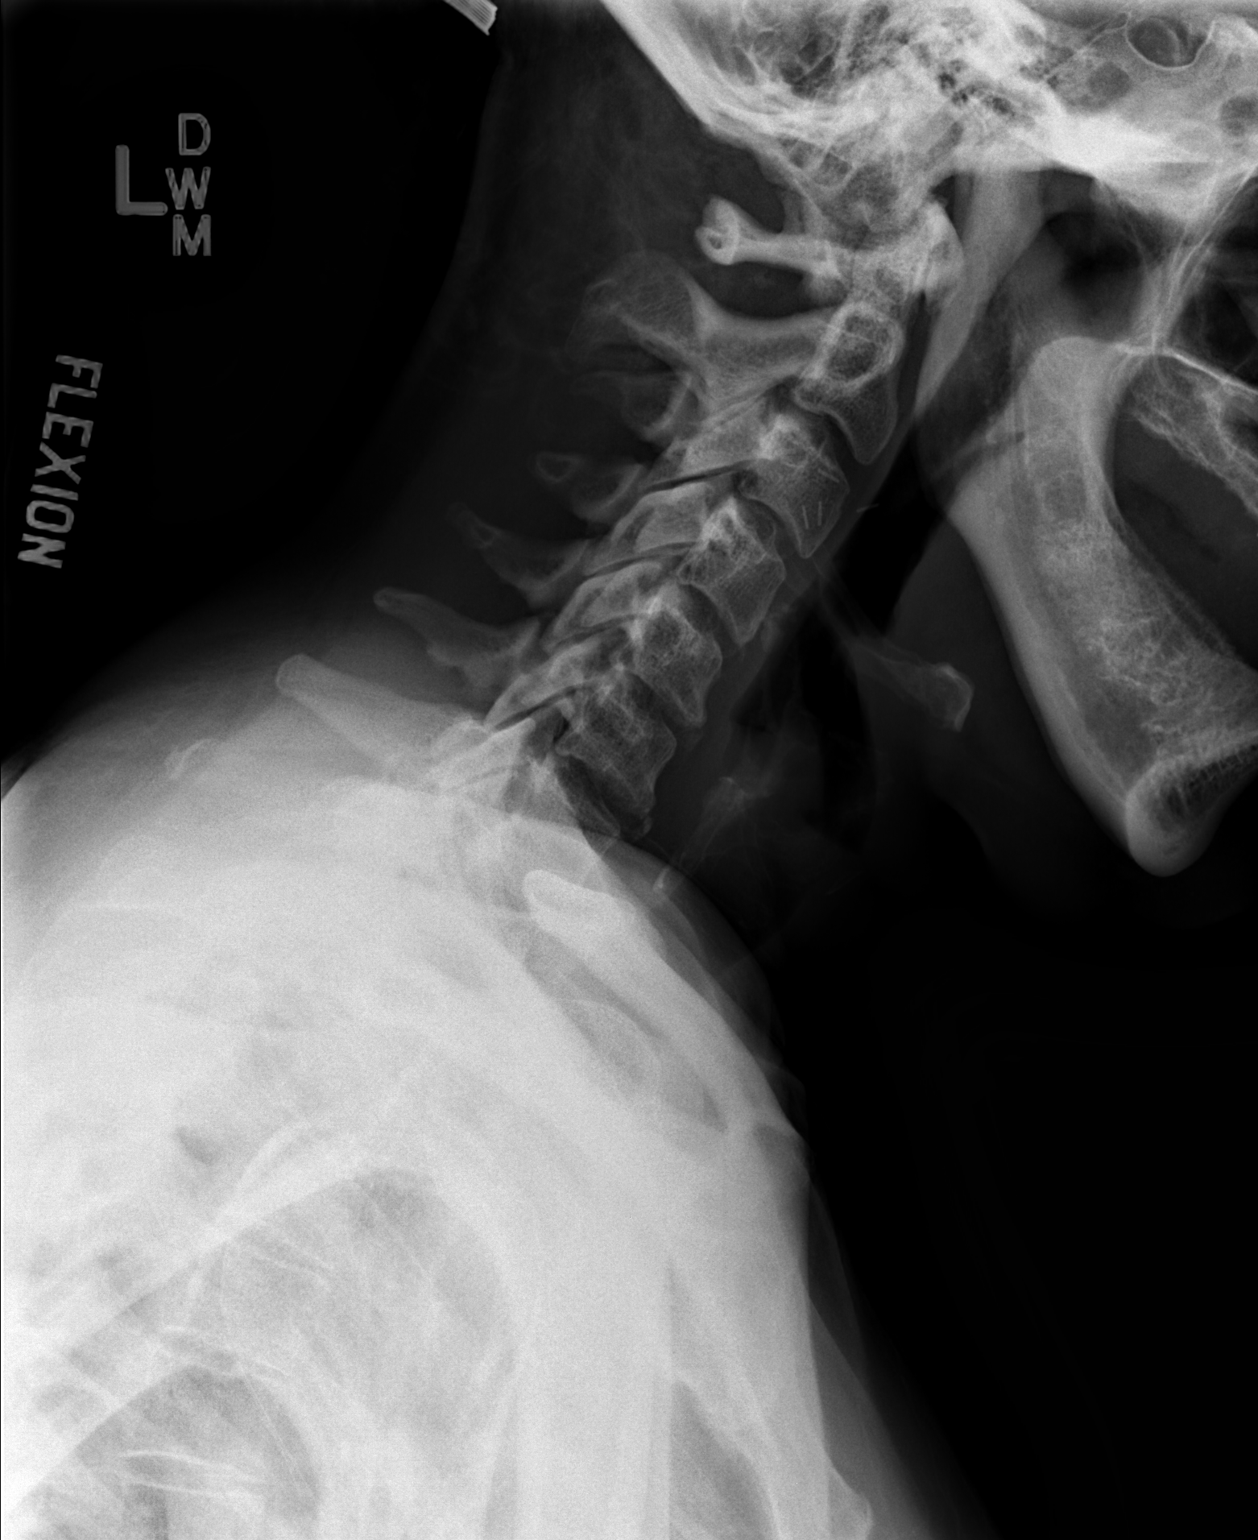

[w c-spine extension]
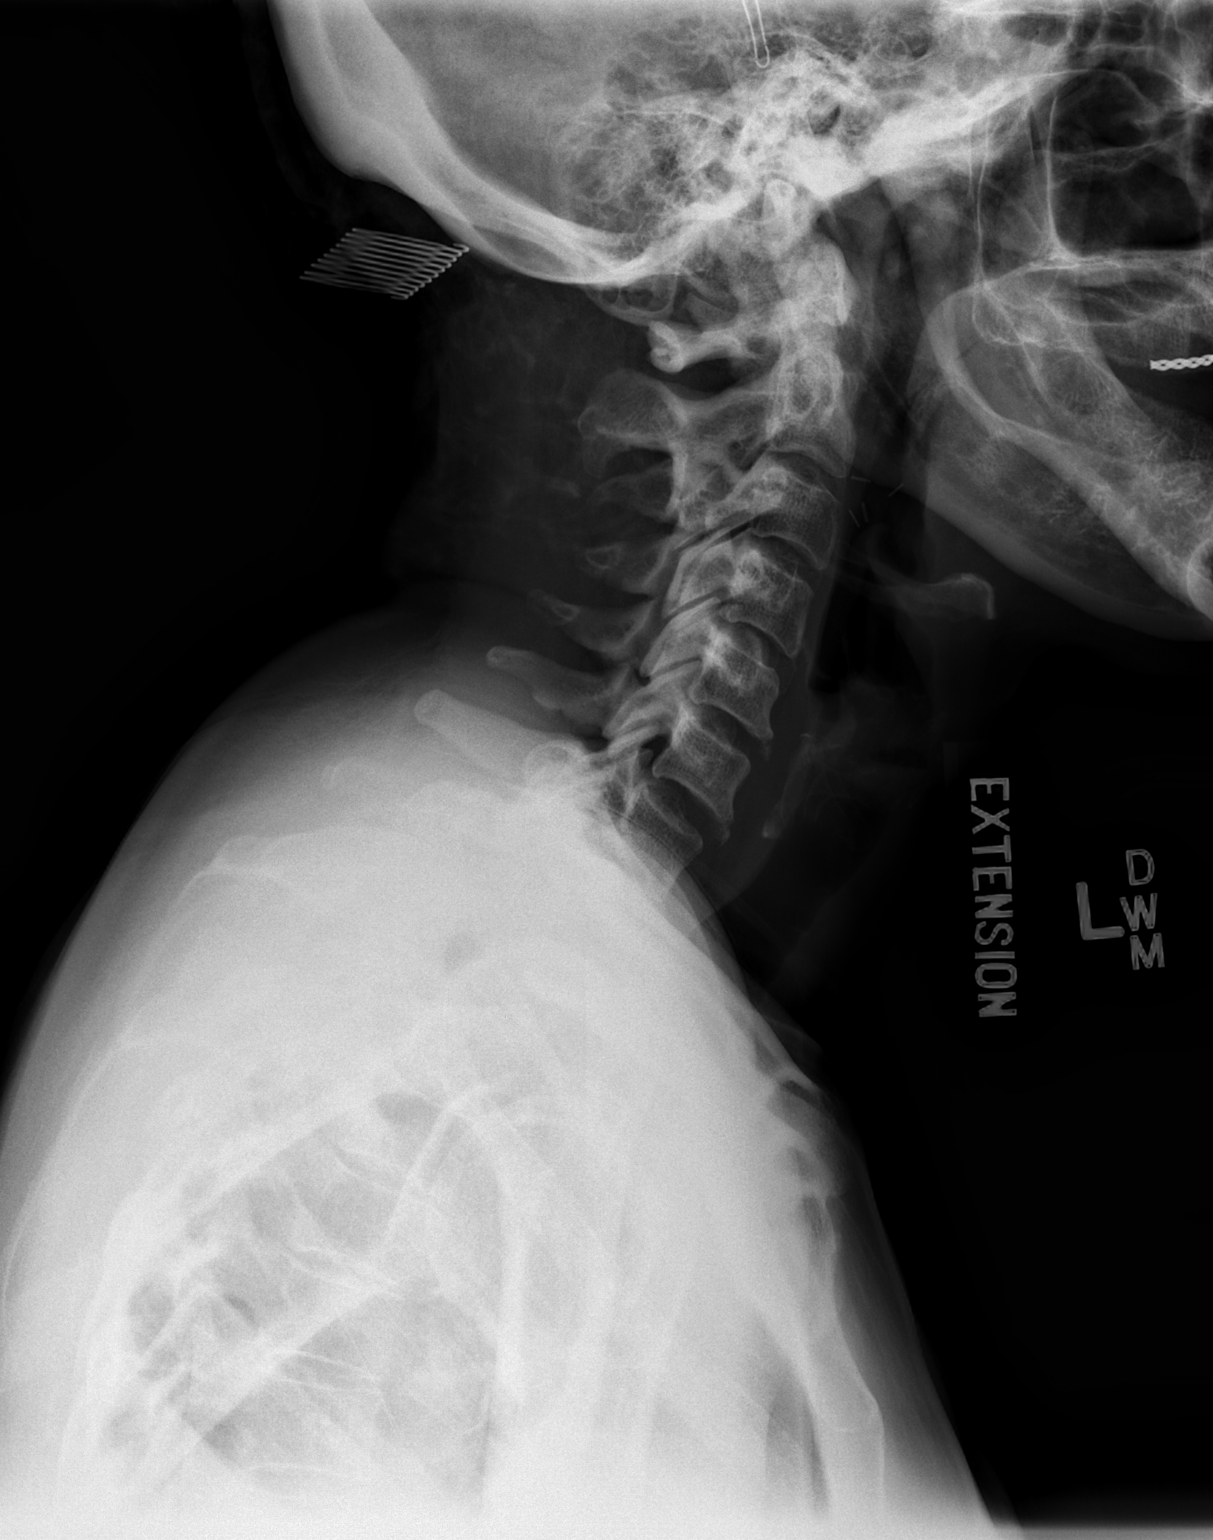

[2 of 2 positions shown; findings below may reference images not displayed]

FINDINGS: Lateral flexion and lateral extension views.  Lateral
flexion view images through the C7 level.  Limited range of motion.
Prevertebral soft tissues are within normal limits.  Maintenance of
vertebral body height.  Spondylosis at C5-C6 and C6-C7.

The extension view also demonstrates limited range of motion.  No
instability across this limited range of motion.  Vertebral body
height is maintained.  The extension view images through C7 as
well.
IMPRESSION: Lower cervical spondylosis.  Extremely limited range of motion.
Maintenance of vertebral body heights.

## 2014-03-09 ENCOUNTER — Ambulatory Visit (INDEPENDENT_AMBULATORY_CARE_PROVIDER_SITE_OTHER): Payer: Medicare Other | Admitting: Podiatry

## 2014-03-09 ENCOUNTER — Encounter: Payer: Self-pay | Admitting: Podiatry

## 2014-03-09 VITALS — BP 161/68 | HR 63 | Ht 67.0 in | Wt 158.0 lb

## 2014-03-09 DIAGNOSIS — B351 Tinea unguium: Secondary | ICD-10-CM

## 2014-03-09 DIAGNOSIS — E119 Type 2 diabetes mellitus without complications: Secondary | ICD-10-CM

## 2014-03-09 DIAGNOSIS — M79609 Pain in unspecified limb: Secondary | ICD-10-CM

## 2014-03-09 DIAGNOSIS — M79606 Pain in leg, unspecified: Secondary | ICD-10-CM

## 2014-03-09 NOTE — Progress Notes (Signed)
Subjective: 58 y.o. year old blinded female patient presents for diabetic foot care. Stated that her blood sugar was in 170's a few days ago.  Diabetic since age 58's. She has been blinded for the past 3 years.  Patient Summary List & History reviewed for allergies, medications, medical problems and surgical history.  Review of Systems - General ROS: negative for - chills, fatigue, hot flashes, night sweats, sleep disturbance, weight gain or weight loss Ophthalmic ROS: Got blined for the last 3 years. It was from Glaucoma.  ENT ROS: negative Allergy and Immunology ROS: negative Breast ROS: Will have it checked soon.  Respiratory ROS: no cough, shortness of breath, or wheezing Cardiovascular ROS: no chest pain or dyspnea on exertion Gastrointestinal ROS: no abdominal pain, change in bowel habits, or black or bloody stools Genito-Urinary ROS: no dysuria, trouble voiding, or hematuria Musculoskeletal ROS: negative Neurological ROS: no TIA or stroke symptoms Dermatological ROS: negative  Objective: Dermatologic:  No big toe nails. Lesser digits have thick dystrophic nails. No open lesions. Vascular: Pedal pulses are faintly palpable on both DP.  PT is not palpable due to mild ankle edema.  Positive of mild ankle edema bilateral.  Orthopedic:  Long digit with dystrophic nail on 2nd bilateral.  Neurologic:  All epicritic and tactile sensations grossly intact. Positive response to Vibratory sensations and Monofilament testing.   Assessment: Dystrophic mycotic nails on lesser digits. Diabetes.  Treatment: All mycotic nails debrided.  Return in 3 months or as needed.

## 2014-03-09 NOTE — Patient Instructions (Signed)
Seen for diabetic foot care.  No abnormal findings other than deformed nails.  Return in 3 months.

## 2014-06-08 ENCOUNTER — Ambulatory Visit: Payer: Medicare Other | Admitting: Podiatry

## 2015-03-02 ENCOUNTER — Encounter (HOSPITAL_BASED_OUTPATIENT_CLINIC_OR_DEPARTMENT_OTHER): Payer: Self-pay | Admitting: *Deleted

## 2015-03-02 ENCOUNTER — Telehealth (HOSPITAL_BASED_OUTPATIENT_CLINIC_OR_DEPARTMENT_OTHER): Payer: Self-pay | Admitting: *Deleted

## 2015-03-02 ENCOUNTER — Emergency Department (HOSPITAL_BASED_OUTPATIENT_CLINIC_OR_DEPARTMENT_OTHER)
Admission: EM | Admit: 2015-03-02 | Discharge: 2015-03-02 | Disposition: A | Discharge status: Home / Self Care | Payer: Medicare Other | Attending: Emergency Medicine | Admitting: Emergency Medicine

## 2015-03-02 DIAGNOSIS — Z794 Long term (current) use of insulin: Secondary | ICD-10-CM | POA: Diagnosis not present

## 2015-03-02 DIAGNOSIS — E119 Type 2 diabetes mellitus without complications: Secondary | ICD-10-CM | POA: Insufficient documentation

## 2015-03-02 DIAGNOSIS — I129 Hypertensive chronic kidney disease with stage 1 through stage 4 chronic kidney disease, or unspecified chronic kidney disease: Secondary | ICD-10-CM | POA: Diagnosis not present

## 2015-03-02 DIAGNOSIS — Z7982 Long term (current) use of aspirin: Secondary | ICD-10-CM | POA: Diagnosis not present

## 2015-03-02 DIAGNOSIS — Z79899 Other long term (current) drug therapy: Secondary | ICD-10-CM | POA: Diagnosis not present

## 2015-03-02 DIAGNOSIS — N183 Chronic kidney disease, stage 3 (moderate): Secondary | ICD-10-CM | POA: Diagnosis not present

## 2015-03-02 DIAGNOSIS — R011 Cardiac murmur, unspecified: Secondary | ICD-10-CM | POA: Diagnosis not present

## 2015-03-02 DIAGNOSIS — E785 Hyperlipidemia, unspecified: Secondary | ICD-10-CM | POA: Diagnosis not present

## 2015-03-02 DIAGNOSIS — H540 Blindness, both eyes: Secondary | ICD-10-CM | POA: Insufficient documentation

## 2015-03-02 DIAGNOSIS — I10 Essential (primary) hypertension: Secondary | ICD-10-CM

## 2015-03-02 HISTORY — DX: Unspecified visual loss: H54.7

## 2015-03-02 HISTORY — DX: Pain in unspecified joint: M25.50

## 2015-03-02 HISTORY — DX: Chronic kidney disease, stage 3 unspecified: N18.30

## 2015-03-02 HISTORY — DX: Chronic kidney disease, stage 3 (moderate): N18.3

## 2015-03-02 HISTORY — DX: Disorder of kidney and ureter, unspecified: N28.9

## 2015-03-02 HISTORY — DX: Hyperlipidemia, unspecified: E78.5

## 2015-03-02 LAB — CBC WITH DIFFERENTIAL/PLATELET
BASOS ABS: 0 10*3/uL (ref 0.0–0.1)
Basophils Relative: 1 % (ref 0–1)
Eosinophils Absolute: 0.1 10*3/uL (ref 0.0–0.7)
Eosinophils Relative: 3 % (ref 0–5)
HEMATOCRIT: 33.9 % — AB (ref 36.0–46.0)
Hemoglobin: 10.8 g/dL — ABNORMAL LOW (ref 12.0–15.0)
LYMPHS PCT: 44 % (ref 12–46)
Lymphs Abs: 2.2 10*3/uL (ref 0.7–4.0)
MCH: 29.2 pg (ref 26.0–34.0)
MCHC: 31.9 g/dL (ref 30.0–36.0)
MCV: 91.6 fL (ref 78.0–100.0)
MONO ABS: 0.5 10*3/uL (ref 0.1–1.0)
Monocytes Relative: 9 % (ref 3–12)
Neutro Abs: 2.2 10*3/uL (ref 1.7–7.7)
Neutrophils Relative %: 43 % (ref 43–77)
Platelets: 269 10*3/uL (ref 150–400)
RBC: 3.7 MIL/uL — ABNORMAL LOW (ref 3.87–5.11)
RDW: 12.6 % (ref 11.5–15.5)
WBC: 5 10*3/uL (ref 4.0–10.5)

## 2015-03-02 LAB — BASIC METABOLIC PANEL
Anion gap: 7 (ref 5–15)
BUN: 26 mg/dL — ABNORMAL HIGH (ref 6–23)
CO2: 28 mmol/L (ref 19–32)
CREATININE: 1.23 mg/dL — AB (ref 0.50–1.10)
Calcium: 10.2 mg/dL (ref 8.4–10.5)
Chloride: 103 mmol/L (ref 96–112)
GFR calc non Af Amer: 47 mL/min — ABNORMAL LOW (ref >90)
GFR, EST AFRICAN AMERICAN: 54 mL/min — AB (ref >90)
Glucose, Bld: 96 mg/dL (ref 70–99)
Potassium: 3.9 mmol/L (ref 3.5–5.1)
Sodium: 138 mmol/L (ref 135–145)

## 2015-03-02 LAB — CBG MONITORING, ED: Glucose-Capillary: 106 mg/dL — ABNORMAL HIGH (ref 70–99)

## 2015-03-02 NOTE — ED Provider Notes (Signed)
CSN: [id number]     Arrival date & time 03/02/15  1056 History   First MD Initiated Contact with Patient 03/02/15 1116     Chief Complaint  Patient presents with  . Hypertension     (Consider location/radiation/quality/duration/timing/severity/associated sxs/prior Treatment) HPI Comments: Patient has a history of hypertension and recently had her medications adjusted approximately one month ago and was at her PCP today for routine checkup. There she was found to be hypertensive and hyperglycemic. She was sent here for further evaluation. She denies recent headaches, weakness, numbness, chest pain or shortness of breath. She was given clonidine in the office prior to arrival  Patient is a 59 y.o. female presenting with hypertension. The history is provided by the patient.  Hypertension This is a chronic problem. Episode frequency: unknown. The problem has not changed since onset.Pertinent negatives include no chest pain, no abdominal pain, no headaches and no shortness of breath. Nothing aggravates the symptoms. Nothing relieves the symptoms.    Past Medical History  Diagnosis Date  . Diabetes mellitus   . Hypertension   . Hyperlipidemia   . CKD (chronic kidney disease) stage 3, GFR 30-59 ml/min   . Joint pain   . Renal insufficiency   . Hyperlipemia   . Blindness    Past Surgical History  Procedure Laterality Date  . Carotid endarterectomy    . Renal artery stent    . Cataract extraction    . Tonsillectomy     No family history on file. History  Substance Use Topics  . Smoking status: Never Smoker   . Smokeless tobacco: Never Used  . Alcohol Use: No   OB History    No data available     Review of Systems  Respiratory: Negative for shortness of breath.   Cardiovascular: Negative for chest pain.  Gastrointestinal: Negative for abdominal pain.  Neurological: Negative for headaches.  All other systems reviewed and are negative.     Allergies  Review of patient's  allergies indicates no known allergies.  Home Medications   Prior to Admission medications   Medication Sig Start Date End Date Taking? Authorizing Provider  aspirin 325 MG tablet Take 81 mg by mouth daily.    Yes Historical Provider, MD  furosemide (LASIX) 20 MG tablet Take 20 mg by mouth daily.   Yes Historical Provider, MD  glipiZIDE-metformin (METAGLIP) 5-500 MG per tablet Take 1 tablet by mouth 2 (two) times daily before a meal.   Yes Historical Provider, MD  insulin glargine (LANTUS) 100 UNIT/ML injection Inject 20 Units into the skin at bedtime.    Yes Historical Provider, MD  losartan (COZAAR) 50 MG tablet Take 100 mg by mouth daily.    Yes Historical Provider, MD  metoprolol (LOPRESSOR) 50 MG tablet Take 50 mg by mouth 2 (two) times daily.   Yes Historical Provider, MD  Multiple Vitamin (MULTIVITAMIN) capsule Take 1 capsule by mouth daily.   Yes Historical Provider, MD  nitroGLYCERIN (NITROSTAT) 0.4 MG SL tablet Place 0.4 mg under the tongue every 5 (five) minutes as needed for chest pain.   Yes Historical Provider, MD  rosuvastatin (CRESTOR) 20 MG tablet Take 20 mg by mouth at bedtime.   Yes Historical Provider, MD  amLODipine (NORVASC) 10 MG tablet Take 10 mg by mouth daily.    Historical Provider, MD  carvedilol (COREG) 25 MG tablet Take 25 mg by mouth 2 (two) times daily with a meal.    Historical Provider, MD   BP 158/80  mmHg  Pulse 60  Temp(Src) 97.6 F (36.4 C) (Oral)  Resp 18  SpO2 100% Physical Exam  Constitutional: She is oriented to person, place, and time. She appears well-developed and well-nourished. No distress.  HENT:  Head: Normocephalic and atraumatic.  Mouth/Throat: Oropharynx is clear and moist.  Eyes:  blind  Neck: Normal range of motion. Neck supple.  Cardiovascular: Normal rate, regular rhythm and intact distal pulses.   Murmur heard.  Systolic murmur is present with a grade of 2/6  Pulmonary/Chest: Effort normal and breath sounds normal. No  respiratory distress. She has no wheezes. She has no rales.  Abdominal: Soft. She exhibits no distension. There is no tenderness. There is no rebound and no guarding.  Musculoskeletal: Normal range of motion. She exhibits no edema or tenderness.  Neurological: She is alert and oriented to person, place, and time.  Skin: Skin is warm and dry. No rash noted. No erythema.  Psychiatric: She has a normal mood and affect. Her behavior is normal.  Nursing note and vitals reviewed.   ED Course  Procedures (including critical care time) Labs Review Labs Reviewed  BASIC METABOLIC PANEL - Abnormal; Notable for the following:    BUN 26 (*)    Creatinine, Ser 1.23 (*)    GFR calc non Af Amer 47 (*)    GFR calc Af Amer 54 (*)    All other components within normal limits  CBC WITH DIFFERENTIAL/PLATELET - Abnormal; Notable for the following:    RBC 3.70 (*)    Hemoglobin 10.8 (*)    HCT 33.9 (*)    All other components within normal limits  CBG MONITORING, ED - Abnormal; Notable for the following:    Glucose-Capillary 106 (*)    All other components within normal limits    Imaging Review No results found.   EKG Interpretation None      MDM   Final diagnoses:  None    Patient with a history of hypertension, chronic kidney disease and diabetes presents today from her PCP office due to an elevated blood pressure. Prior to arrival patient's blood pressure was 220/90 in the office with a CBG of 43. Patient ate a snack and was given clonidine in the office. On arrival here her blood pressure is 158/80 with a blood sugar in the 100s. She said she did not eat her normal oatmeal this morning which is why she thinks her blood sugar was low. In the last month patient has had adjustment of her blood pressure medication and she was going for routine visit. She denies any chest pain, shortness of breath, headaches or unilateral weakness or numbness. Patient's renal function is unchanged from her result  from her nephrologist 2 months ago. Labs here are without acute change. Recommended that patient follow-up with her PCP, nephrologist and cardiologist for further blood pressure modification. She has not been checking at home because she is blind and does not have a cuff that speaks. Recommended that family member checks her pressure when they are there so she has a chart for her physicians. She sees her cardiologist next week.   Gwyneth Sprout, MD 03/02/15 1254

## 2015-03-02 NOTE — ED Notes (Signed)
Spoke with pt by phone to inform she had left discharge paperwork in room and that the papers would be in registration if she wished to pick them up

## 2015-03-02 NOTE — ED Notes (Signed)
MD at bedside. 

## 2015-03-02 NOTE — Discharge Instructions (Signed)
DASH Eating Plan DASH stands for "Dietary Approaches to Stop Hypertension." The DASH eating plan is a healthy eating plan that has been shown to reduce high blood pressure (hypertension). Additional health benefits may include reducing the risk of type 2 diabetes mellitus, heart disease, and stroke. The DASH eating plan may also help with weight loss. WHAT DO I NEED TO KNOW ABOUT THE DASH EATING PLAN? For the DASH eating plan, you will follow these general guidelines:  Choose foods with a percent daily value for sodium of less than 5% (as listed on the food label).  Use salt-free seasonings or herbs instead of table salt or sea salt.  Check with your health care provider or pharmacist before using salt substitutes.  Eat lower-sodium products, often labeled as "lower sodium" or "no salt added."  Eat fresh foods.  Eat more vegetables, fruits, and low-fat dairy products.  Choose whole grains. Look for the word "whole" as the first word in the ingredient list.  Choose fish and skinless chicken or turkey more often than red meat. Limit fish, poultry, and meat to 6 oz (170 g) each day.  Limit sweets, desserts, sugars, and sugary drinks.  Choose heart-healthy fats.  Limit cheese to 1 oz (28 g) per day.  Eat more home-cooked food and less restaurant, buffet, and fast food.  Limit fried foods.  Cook foods using methods other than frying.  Limit canned vegetables. If you do use them, rinse them well to decrease the sodium.  When eating at a restaurant, ask that your food be prepared with less salt, or no salt if possible. WHAT FOODS CAN I EAT? Seek help from a dietitian for individual calorie needs. Grains Whole grain or whole wheat bread. Brown rice. Whole grain or whole wheat pasta. Quinoa, bulgur, and whole grain cereals. Low-sodium cereals. Corn or whole wheat flour tortillas. Whole grain cornbread. Whole grain crackers. Low-sodium crackers. Vegetables Fresh or frozen vegetables  (raw, steamed, roasted, or grilled). Low-sodium or reduced-sodium tomato and vegetable juices. Low-sodium or reduced-sodium tomato sauce and paste. Low-sodium or reduced-sodium canned vegetables.  Fruits All fresh, canned (in natural juice), or frozen fruits. Meat and Other Protein Products Ground beef (85% or leaner), grass-fed beef, or beef trimmed of fat. Skinless chicken or turkey. Ground chicken or turkey. Pork trimmed of fat. All fish and seafood. Eggs. Dried beans, peas, or lentils. Unsalted nuts and seeds. Unsalted canned beans. Dairy Low-fat dairy products, such as skim or 1% milk, 2% or reduced-fat cheeses, low-fat ricotta or cottage cheese, or plain low-fat yogurt. Low-sodium or reduced-sodium cheeses. Fats and Oils Tub margarines without trans fats. Light or reduced-fat mayonnaise and salad dressings (reduced sodium). Avocado. Safflower, olive, or canola oils. Natural peanut or almond butter. Other Unsalted popcorn and pretzels. The items listed above may not be a complete list of recommended foods or beverages. Contact your dietitian for more options. WHAT FOODS ARE NOT RECOMMENDED? Grains White bread. White pasta. White rice. Refined cornbread. Bagels and croissants. Crackers that contain trans fat. Vegetables Creamed or fried vegetables. Vegetables in a cheese sauce. Regular canned vegetables. Regular canned tomato sauce and paste. Regular tomato and vegetable juices. Fruits Dried fruits. Canned fruit in light or heavy syrup. Fruit juice. Meat and Other Protein Products Fatty cuts of meat. Ribs, chicken wings, bacon, sausage, bologna, salami, chitterlings, fatback, hot dogs, bratwurst, and packaged luncheon meats. Salted nuts and seeds. Canned beans with salt. Dairy Whole or 2% milk, cream, half-and-half, and cream cheese. Whole-fat or sweetened yogurt. Full-fat   cheeses or blue cheese. Nondairy creamers and whipped toppings. Processed cheese, cheese spreads, or cheese  curds. Condiments Onion and garlic salt, seasoned salt, table salt, and sea salt. Canned and packaged gravies. Worcestershire sauce. Tartar sauce. Barbecue sauce. Teriyaki sauce. Soy sauce, including reduced sodium. Steak sauce. Fish sauce. Oyster sauce. Cocktail sauce. Horseradish. Ketchup and mustard. Meat flavorings and tenderizers. Bouillon cubes. Hot sauce. Tabasco sauce. Marinades. Taco seasonings. Relishes. Fats and Oils Butter, stick margarine, lard, shortening, ghee, and bacon fat. Coconut, palm kernel, or palm oils. Regular salad dressings. Other Pickles and olives. Salted popcorn and pretzels. The items listed above may not be a complete list of foods and beverages to avoid. Contact your dietitian for more information. WHERE CAN I FIND MORE INFORMATION? National [employer], [employer], [employer]: www.nhlbi.nih.gov/health/health-topics/topics/dash/ Document Released: 10/31/2011 Document Revised: 03/28/2014 Document Reviewed: 09/15/2013 [employer] Patient Information 2015 [employer]. This information is not intended to replace advice given to you by your health care provider. Make sure you discuss any questions you have with your health care provider. Hypertension Hypertension, commonly called high blood pressure, is when the force of blood pumping through your arteries is too strong. Your arteries are the blood vessels that carry blood from your heart throughout your body. A blood pressure reading consists of a higher number over a lower number, such as 110/72. The higher number (systolic) is the pressure inside your arteries when your heart pumps. The lower number (diastolic) is the pressure inside your arteries when your heart relaxes. Ideally you want your blood pressure below 120/80. Hypertension forces your heart to work harder to pump blood. Your arteries may become narrow or stiff. Having hypertension puts you at risk for heart disease, stroke, and other problems.  RISK  FACTORS Some risk factors for high blood pressure are controllable. Others are not.  Risk factors you cannot control include:   Race. You may be at higher risk if you are African American.  Age. Risk increases with age.  Gender. Men are at higher risk than women before age 45 years. After age 65, women are at higher risk than men. Risk factors you can control include:  Not getting enough exercise or physical activity.  Being overweight.  Getting too much fat, sugar, calories, or salt in your diet.  Drinking too much alcohol. SIGNS AND SYMPTOMS Hypertension does not usually cause signs or symptoms. Extremely high blood pressure (hypertensive crisis) may cause headache, anxiety, shortness of breath, and nosebleed. DIAGNOSIS  To check if you have hypertension, your health care provider will measure your blood pressure while you are seated, with your arm held at the level of your heart. It should be measured at least twice using the same arm. Certain conditions can cause a difference in blood pressure between your right and left arms. A blood pressure reading that is higher than normal on one occasion does not mean that you need treatment. If one blood pressure reading is high, ask your health care provider about having it checked again. TREATMENT  Treating high blood pressure includes making lifestyle changes and possibly taking medicine. Living a healthy lifestyle can help lower high blood pressure. You may need to change some of your habits. Lifestyle changes may include:  Following the DASH diet. This diet is high in fruits, vegetables, and whole grains. It is low in salt, red meat, and added sugars.  Getting at least 2 hours of brisk physical activity every week.  Losing weight if necessary.  Not smoking.  Limiting   alcoholic beverages.  Learning ways to reduce stress. If lifestyle changes are not enough to get your blood pressure under control, your health care provider may  prescribe medicine. You may need to take more than one. Work closely with your health care provider to understand the risks and benefits. HOME CARE INSTRUCTIONS  Have your blood pressure rechecked as directed by your health care provider.   Take medicines only as directed by your health care provider. Follow the directions carefully. Blood pressure medicines must be taken as prescribed. The medicine does not work as well when you skip doses. Skipping doses also puts you at risk for problems.   Do not smoke.   Monitor your blood pressure at home as directed by your health care provider. SEEK MEDICAL CARE IF:   You think you are having a reaction to medicines taken.  You have recurrent headaches or feel dizzy.  You have swelling in your ankles.  You have trouble with your vision. SEEK IMMEDIATE MEDICAL CARE IF:  You develop a severe headache or confusion.  You have unusual weakness, numbness, or feel faint.  You have severe chest or abdominal pain.  You vomit repeatedly.  You have trouble breathing. MAKE SURE YOU:   Understand these instructions.  Will watch your condition.  Will get help right away if you are not doing well or get worse. Document Released: 11/11/2005 Document Revised: 03/28/2014 Document Reviewed: 09/03/2013 [employer] Patient Information 2015 [employer]. This information is not intended to replace advice given to you by your health care provider. Make sure you discuss any questions you have with your health care provider.  

## 2015-03-02 NOTE — ED Notes (Signed)
Pt d/c home with son to drive

## 2015-03-02 NOTE — ED Notes (Signed)
CBG 106. Joss Benton, RN notified.

## 2015-03-02 NOTE — ED Notes (Addendum)
Pt sent from PCP by EMS re: elevated b/p 220/90 and CBG 43. Pt received 0.3 clonidine, 3 glucose tabs and a snack pta- b/p 158/80, and cbg 106 upon arrival to ED- pt denies pain or other symptoms- reports she was PCP for a routine bp check

## 2015-05-22 ENCOUNTER — Other Ambulatory Visit (HOSPITAL_BASED_OUTPATIENT_CLINIC_OR_DEPARTMENT_OTHER): Payer: Self-pay | Admitting: Internal Medicine

## 2015-05-22 DIAGNOSIS — Z1231 Encounter for screening mammogram for malignant neoplasm of breast: Secondary | ICD-10-CM

## 2015-05-22 DIAGNOSIS — M810 Age-related osteoporosis without current pathological fracture: Secondary | ICD-10-CM

## 2015-07-23 NOTE — H&P (Signed)
OFFICE VISIT NOTES COPIED TO EPIC FOR DOCUMENTATION  Andrea Oconnell 07-14-15 8:14 AM Location: [facility] Patient #: [id number] DOB: 1955-12-22 Single / Language: Andrea Oconnell / Race: Black or African American Female  History of Present Illness Andrea Page MD; 07/14/15 4:45 PM) Patient words: 6 week f/u; Pt denies any new symptoms since her last o/v. Orthostatics obtained today.  The patient is a 59 year old female who presents for a Follow-up for Hypertension. Andrea Oconnell is a 59 year old African American female with renovascular hypertension, previously controlled s/p balloon angioplasty of right renal artery stenosis in 2012, carotid stenosis s/p Carotid endarterectomy in 2011, uncontrolled diabetes and diabetic retinopathy, glaucoma leading to blindness.  About 6 months ago, she was noted to be extremely orthostatic despite stopping amlodipine/benazapril so carvedilol dose was decreased and she was advised to begin wearing support stockings daily. However since changing medications, she had developed uncontrolled hypertension. Medications were reinitiated and amlodpine was added. Renal duplex ordered for reevaluation of renal artery stenosis which was negative, in April 2016.  Patient presents to me for follow-up and evaluation of hypertension, two-month office visit, recently due to uncontrolled hypertension, patient was started on valsartan, she had worsening renal function, hence on her last office visit at reduce the dose of valsartan to one half tablet daily. She is presently doing well and presents here for follow-up.  Problem List/Past Medical Andrea Oconnell; 07/14/15 9:51 AM) H/O carotid endarterectomy (Z98.89) Carotid artery duplex 03/30/2015: No hemodynamically significant arterial disease in the internal carotid artery bilaterally. Mild plaque evident bilaterally. Right carotid endarterectomy site patent. No change from 11/04/2013. S/P Right Carotid endarterectomy on  11/05/10 by Althea Charon, MD. Carotid Doppler. 11/04/2013 No evidence of hemodynamically significant stenosis in the bilateral carotid bifurcation vessels. Mild bilateral intimal thickening. Study suggests widely patent right carotid endarterctomy. Mixed hyperlipidemia (E78.2) Labs 12/09/2014: Total cholesterol 169, triglycerides 64, HDL 74, LDL 82, homocystine 11.5 DM type 2, uncontrolled, with neuropathy (E11.40, E11.65) Labs 12/09/2014: HbA1c 8.5% Labs 07/15/2014: HbA1c 11.2% Angle-closure glaucoma, severe stage (H40.20X3) Glaucoma, obstructive (365.20): Blind in both eyes due to glaucoma since Sept 2012. Diabetic autonomic neuropathy associated with type 2 diabetes mellitus (E11.43) Autonomic orthostatic hypotension (I95.1) Essential hypertension, benign (I10) Labs 06/02/2015: creatinine 1.55, eGFR 41, potassium 4.9 Labs 05/01/2015: Serum glucose 212 mg, BUN 35, serum creatinine 1.85, eGFR 33 mL. Labs 02/20/2015: BUN 20, serum creatinine 1.18, eGFR 58 mL. Electrolytes within normal limits. Urine analysis within normal limits. Bilateral carotid bruits (R09.89) Secondary renovascular hypertension, malignant (I15.0)11/30/2010 Renal artery duplex 03/23/2015: No evidence of renal artery occlusive disease in right renal artery. Prior PTA site right kidney appears patent. Normal intrarenal vascular perfusion is noted in the right kidney. Left kidney is not well visualized due to bowel shadow. Clinical correlation recommended. S/P PTA and balloon angioplasty of the right inferior pole renal artery with 5x20 mm balloon on 11/30/2010. Echocardiogram 08/23/2014: Left ventricle cavity is normal in size. Moderate concentric hypertrophy of the left ventricle. Normal global wall motion. Doppler evidence of grade II (pseudonormal) diastolic dysfunction. Diastolic dysfunction suggest elevated LA/LV endiastolic pressure. Calculated EF 69%. Mild mitral regurgitation. Mild mitral valve leaflet thickening.  Allergies Andrea Brook  Oconnell; 07-14-2015 9:51 AM) No Known Drug Allergies11/10/2012  Family History Andrea Oconnell; 2015/07/14 9:51 AM) Mother Deceased. at age 47-diabetes, and heart failure. Father Deceased. at age 52- unknown causes. Siblings has 4 sisters all living- no known heart conditons. 3 older, 1 twin  Social History Andrea Oconnell; 07/14/15 9:51 AM) Current  tobacco use Never smoker. Non Drinker/No Alcohol Use Marital status Divorced. Living Situation 2 Sons live with her Number of Children 2.  Past Surgical History [name]; 2015/07/25 9:51 AM) Right carotid endarterectomy 11/05/10 Dr. Donnetta Hutching. S/P PTA and balloon angioplasty of the right inferior pole renal artery with 5x20 mm balloon on 11/30/2010., 2 renal arteries on either sides bilateral eye surgery09/2013 Blind in both eyes due to glaucoma since Sept 2012.  Medication History Andrea Oconnell; 2015-07-25 9:51 AM) Nitrostat (0.4MG Tab Sublingual, 1 (one) Tab Sublingual Tab Sub Sublingual every 5 minutes as needed for chest pain., Taken starting 05/01/2015) Active. AmLODIPine Besylate (5MG Tablet, 1 (one) Tablet Tablet Oral daily, Taken starting 05/01/2015) Active. Valsartan-Hydrochlorothiazide (320-25MG Tablet,  (one half) Tablet Oral every morning, Taken starting 05/01/2015) Active. Metoprolol Succinate ER (50MG Tablet ER 24HR, 1 Oral two times daily, Taken starting 01/04/2015) Active. Lantus (100UNIT/ML Solution, 25 units Subcutaneous at bedtime, Taken starting 08/10/2014) Active. Multivitamins (1 Oral daily) Active. Crestor (20MG Tablet, 1 Oral at bedtime) Active. GlipiZIDE-MetFORMIN HCl (5-500MG Tablet, 1 Oral two times daily) Active. Aspirin Childrens (81MG Tablet Chewable, 1 Oral daily) Active. Brimonidine Tartrate (0.2% Solution, 1 drop Ophthalmic right eye twice daily) Active. Medications Reconciled  Diagnostic Studies History Andrea Oconnell; July 25, 2015 9:51 AM) Carotid doppler 01/09/12. ECG 03/16/12: NSR @  73/min. Normal intervals. Poor R progression. No ischemia.  Nuc Stress Report: 10/05/10: Normal without ischemia.  CT Abdominal Angio: FMD right renal artery, normal Left. 30% celiac and 90% IMA stenosis Carotid Doppler12/09/2013 No evidence of hemodynamically significant stenosis in the bilateral carotid bifurcation vessels. Mild bilateral intimal thickening. Study suggests widely patent right carotid endarterctomy. 11/04/12: No evidence of hemodynamically significant stenosis in the bilateral carotid bifurcation vessels. There is evidence of heterogeneous plaque in the left carotid artery. Right carotid endarterectomy is widely patent. Recheck 1 year Carotid Artery Duplex05/03/2015 No hemodynamically significant arterial disease in the internal carotid artery bilaterally. Mild plaque evident bilaterally. Right carotid endarterectomy site patent. No change from 11/04/2013.  Other Problems Andrea Oconnell; 07/25/2015 9:51 AM) ED visit on 02/16/12 for left shoulder pain/Torticollis. H/O Doppler ultrasound (Z92.89)11/05/2012 Carotid duplex 11/04/12: No evidence of hemodynamically significant stenosis in the bilateral carotid bifurcation vessels. There is evidence of heterogeneous plaque in the left carotid artery. Right carotid endarterectomy is widely patent. Recheck 1 year   Review of Systems Andrea Page, MD; July 25, 2015 4:45 PM) General Present- Feeling well. Not Present- Fatigue, Night Sweats and Unable to Sleep Lying Flat. HEENT Present- Glaucoma and Visual Loss (blind in both eyes). Not Present- Headache. Respiratory Not Present- Bloody sputum, Difficulty Breathing on Exertion, Dyspnea and Hemoptysis. Cardiovascular Not Present- Chest Pain, Fainting / Blacking Out, Leg Cramps and Leg Pain and/or Swelling. Gastrointestinal Not Present- Black, Tarry Stool and Bloody Stool. Neurological Present- Dizziness (occasional). Not Present- Focal Neurological Symptoms. Endocrine Not Present- Heat  Intolerance and Polyuria. Hematology Not Present- Easy Bleeding, Excessive bleeding, Nose Bleed and Petechiae. Vitals Andrea Oconnell; 2015-07-25 10:02 AM) 07/25/15 10:01 AM Weight: 146.44 lb Height: 65in Body Surface Area: 1.73 m Body Mass Index: 24.37 kg/m  Pulse: 63 (Regular)  P.OX: 98% (Room air) BP: 160/83 (Standing, Left Arm, Standard)    25-Jul-2015 10:00 AM Weight: 146.44 lb Height: 65in Body Surface Area: 1.73 m Body Mass Index: 24.37 kg/m  Pulse: 62 (Regular)  P.OX: 98% (Room air) BP: 168/84 (Sitting, Left Arm, Standard)    07/25/2015 9:51 AM Weight: 146.44 lb Height: 65in Body Surface Area: 1.73 m Body Mass Index: 24.37 kg/m  Pulse: 64 (  Regular)  P.OX: 99% (Room air) BP: 170/93 (Supine, Left Arm, Standard)     Physical Exam Andrea Page, MD; 07/03/2015 4:45 PM) General Mental Status-Alert. General Appearance-Cooperative, Appears stated age, Not in acute distress. Orientation-Oriented X3. Build & Nutrition-Well nourished and Moderately built.  Head and Neck Thyroid Gland Characteristics - no palpable nodules, no palpable enlargement.  Chest and Lung Exam Palpation Tender - No chest wall tenderness. Auscultation Breath sounds - Clear.  Cardiovascular Inspection Jugular vein - Right - No Distention. Auscultation Heart Sounds - S1 WNL, S2 WNL and No gallop present. Murmurs & Other Heart Sounds - Murmur - No murmur.  Abdomen Palpation/Percussion Normal exam - Non Tender and No hepatosplenomegaly. Auscultation Normal exam - Bowel sounds normal.  Peripheral Vascular Lower Extremity Inspection - Left - No Pigmentation, No Varicose veins. Right - No Pigmentation, No Varicose veins. Palpation - Edema - Left - No edema. Right - No edema. Femoral pulse - Left - Normal. Right - Normal. Popliteal pulse - Left - Normal. Right - Normal. Dorsalis pedis pulse - Left - Normal. Right - Normal. Posterior tibial pulse - Left -  Normal. Right - Normal. Carotid arteries - Bilateral-Soft Bruit(left carotid endarterectomy scar noted.). Abdomen-No prominent abdominal aortic pulsation, No epigastric bruit.  Neurologic Motor-Grossly intact without any focal deficits.  Musculoskeletal Global Assessment Left Lower Extremity - normal range of motion without pain. Right Lower Extremity - normal range of motion without pain.  Assessment & Plan Andrea Page MD; 07/03/2015 4:45 PM) Secondary renovascular hypertension, malignant (I15.0) Story: Renal artery duplex 03/23/2015: No evidence of renal artery occlusive disease in right renal artery. Prior PTA site right kidney appears patent. Normal intrarenal vascular perfusion is noted in the right kidney. Left kidney is not well visualized due to bowel shadow. Clinical correlation recommended.  S/P PTA and balloon angioplasty of the right inferior pole renal artery with 5x20 mm balloon on 11/30/2010. Future Plans  Essential hypertension, benign (I10) Story: Labs 06/02/2015: creatinine 1.55, eGFR 41, potassium 4.9  Labs 05/01/2015: Serum glucose 212 mg, BUN 35, serum creatinine 1.85, eGFR 33 mL.  Labs 02/20/2015: BUN 20, serum creatinine 1.18, eGFR 58 mL. Electrolytes within normal limits. Urine analysis within normal limits. Autonomic orthostatic hypotension (I95.1) DM type 2, uncontrolled, with neuropathy (E11.40) Story: Labs 12/09/2014: HbA1c 8.5%  Labs 07/15/2014: HbA1c 11.2% Mixed hyperlipidemia (E78.2) Story: Labs 12/09/2014: Total cholesterol 169, triglycerides 64, HDL 74, LDL 82, homocystine 11.5 Current Plans Mechanism of underlying disease process and action of medications discussed with the patient. I discussed primary/secondary prevention and also dietary counceling was done. The patient is still uncontrolled, she has worsening renal function, serum creatinine prior to initiation of ARB was 1.18, had risen to 1.86, which is now down to 1.5 since reducing the dose  of valsartan. Due to uncontrolled hypertension and history of renal artery stenosis previously, I suspect she has recurrence of renal artery stenosis, hence I'll set her up for repeat renal angiography and possible angioplasty and see her back after this. I advised her to discontinue valsartan for now. Addendum Note(Bridgette Allison AGNP-C; 07/21/2015 2:16 PM) Labs 07/20/2015: serum glucose 130, creatinine 1.23, eGFR 55, potassium 4.8, HB 10.2/HCT 31.4 with normocytic indices, PT 10.4, INR 1.0     Signed by Andrea Page, MD (07/03/2015 4:46 PM)

## 2015-07-25 ENCOUNTER — Encounter (HOSPITAL_COMMUNITY)
Admission: RE | Disposition: A | Discharge status: Home / Self Care | Payer: Self-pay | Source: Ambulatory Visit | Attending: Cardiology

## 2015-07-25 ENCOUNTER — Ambulatory Visit (HOSPITAL_COMMUNITY)
Admission: RE | Admit: 2015-07-25 | Discharge: 2015-07-25 | Disposition: A | Discharge status: Home / Self Care | Payer: Medicare Other | Source: Ambulatory Visit | Attending: Cardiology | Admitting: Cardiology

## 2015-07-25 DIAGNOSIS — H4020X3 Unspecified primary angle-closure glaucoma, severe stage: Secondary | ICD-10-CM | POA: Diagnosis not present

## 2015-07-25 DIAGNOSIS — Z794 Long term (current) use of insulin: Secondary | ICD-10-CM | POA: Insufficient documentation

## 2015-07-25 DIAGNOSIS — I773 Arterial fibromuscular dysplasia: Secondary | ICD-10-CM | POA: Insufficient documentation

## 2015-07-25 DIAGNOSIS — E11649 Type 2 diabetes mellitus with hypoglycemia without coma: Secondary | ICD-10-CM | POA: Diagnosis not present

## 2015-07-25 DIAGNOSIS — I10 Essential (primary) hypertension: Secondary | ICD-10-CM | POA: Insufficient documentation

## 2015-07-25 DIAGNOSIS — E1143 Type 2 diabetes mellitus with diabetic autonomic (poly)neuropathy: Secondary | ICD-10-CM | POA: Diagnosis not present

## 2015-07-25 DIAGNOSIS — I340 Nonrheumatic mitral (valve) insufficiency: Secondary | ICD-10-CM | POA: Diagnosis not present

## 2015-07-25 DIAGNOSIS — I150 Renovascular hypertension: Secondary | ICD-10-CM | POA: Diagnosis not present

## 2015-07-25 DIAGNOSIS — E782 Mixed hyperlipidemia: Secondary | ICD-10-CM | POA: Diagnosis not present

## 2015-07-25 DIAGNOSIS — I701 Atherosclerosis of renal artery: Secondary | ICD-10-CM

## 2015-07-25 DIAGNOSIS — E11319 Type 2 diabetes mellitus with unspecified diabetic retinopathy without macular edema: Secondary | ICD-10-CM | POA: Insufficient documentation

## 2015-07-25 HISTORY — PX: PERIPHERAL VASCULAR CATHETERIZATION: SHX172C

## 2015-07-25 LAB — GLUCOSE, CAPILLARY: Glucose-Capillary: 189 mg/dL — ABNORMAL HIGH (ref 65–99)

## 2015-07-25 SURGERY — RENAL ANGIOGRAPHY

## 2015-07-25 MED ORDER — FENTANYL CITRATE (PF) 100 MCG/2ML IJ SOLN
INTRAMUSCULAR | Status: DC | PRN
  Administered 2015-07-25: 25 ug via INTRAVENOUS

## 2015-07-25 MED ORDER — MIDAZOLAM HCL 2 MG/2ML IJ SOLN
INTRAMUSCULAR | Status: DC | PRN
  Administered 2015-07-25: 2 mg via INTRAVENOUS

## 2015-07-25 MED ORDER — FENTANYL CITRATE (PF) 100 MCG/2ML IJ SOLN
INTRAMUSCULAR | Status: AC
  Filled 2015-07-25: qty 4

## 2015-07-25 MED ORDER — HYDRALAZINE HCL 20 MG/ML IJ SOLN
INTRAMUSCULAR | Status: DC | PRN
  Administered 2015-07-25 (×2): 10 mg via INTRAVENOUS

## 2015-07-25 MED ORDER — SODIUM CHLORIDE 0.9 % IV SOLN: 1.0000 mL/kg/h | INTRAVENOUS | Status: DC

## 2015-07-25 MED ORDER — HYDRALAZINE HCL 20 MG/ML IJ SOLN
INTRAMUSCULAR | Status: AC
  Filled 2015-07-25: qty 1

## 2015-07-25 MED ORDER — LIDOCAINE HCL (PF) 1 % IJ SOLN
INTRAMUSCULAR | Status: AC
  Filled 2015-07-25: qty 30

## 2015-07-25 MED ORDER — HEPARIN (PORCINE) IN NACL 2-0.9 UNIT/ML-% IJ SOLN
INTRAMUSCULAR | Status: AC
  Filled 2015-07-25: qty 1000

## 2015-07-25 MED ORDER — MIDAZOLAM HCL 2 MG/2ML IJ SOLN
INTRAMUSCULAR | Status: AC
  Filled 2015-07-25: qty 4

## 2015-07-25 MED ORDER — GLIPIZIDE-METFORMIN HCL 5-500 MG PO TABS: 1.0000 | ORAL_TABLET | Freq: Two times a day (BID) | ORAL | Status: DC

## 2015-07-25 MED ORDER — IODIXANOL 320 MG/ML IV SOLN
INTRAVENOUS | Status: DC | PRN
  Administered 2015-07-25: 34 mL via INTRA_ARTERIAL

## 2015-07-25 MED ORDER — HYDRALAZINE HCL 50 MG PO TABS: 50.0000 mg | ORAL_TABLET | Freq: Three times a day (TID) | ORAL | Status: DC

## 2015-07-25 MED ORDER — SODIUM CHLORIDE 0.9 % IV SOLN: INTRAVENOUS | Status: DC

## 2015-07-25 MED ORDER — LIDOCAINE HCL (PF) 1 % IJ SOLN
INTRAMUSCULAR | Status: DC | PRN
  Administered 2015-07-25: 10:00:00

## 2015-07-25 SURGICAL SUPPLY — 9 items
CATH CROSS OVER TEMPO 5F (CATHETERS) ×2 IMPLANT
CATH OMNI FLUSH 5F 65CM (CATHETERS) ×2 IMPLANT
KIT MICROINTRODUCER STIFF 5F (SHEATH) ×2 IMPLANT
KIT PV (KITS) ×2 IMPLANT
SHEATH PINNACLE 6F 10CM (SHEATH) ×2 IMPLANT
SYRINGE MEDRAD AVANTA MACH 7 (SYRINGE) ×2 IMPLANT
TRANSDUCER W/STOPCOCK (MISCELLANEOUS) ×2 IMPLANT
TRAY PV CATH (CUSTOM PROCEDURE TRAY) ×2 IMPLANT
WIRE HITORQ VERSACORE ST 145CM (WIRE) ×2 IMPLANT

## 2015-07-25 NOTE — Discharge Instructions (Signed)
Angiogram, Care After °Refer to this sheet in the next few weeks. These instructions provide you with information on caring for yourself after your procedure. Your health care provider may also give you more specific instructions. Your treatment has been planned according to current medical practices, but problems sometimes occur. Call your health care provider if you have any problems or questions after your procedure.  °WHAT TO EXPECT AFTER THE PROCEDURE °After your procedure, it is typical to have the following sensations: °· Minor discomfort or tenderness and a small bump at the catheter insertion site. The bump should usually decrease in size and tenderness within 1 to 2 weeks. °· Any bruising will usually fade within 2 to 4 weeks. °HOME CARE INSTRUCTIONS  °· You may need to keep taking blood thinners if they were prescribed for you. Take medicines only as directed by your health care provider. °· Do not apply powder or lotion to the site. °· Do not take baths, swim, or use a hot tub until your health care provider approves. °· You may shower 24 hours after the procedure. Remove the bandage (dressing) and gently wash the site with plain soap and water. Gently pat the site dry. °· Inspect the site at least twice daily. °· Limit your activity for the first 48 hours. Do not bend, squat, or lift anything over 20 lb (9 kg) or as directed by your health care provider. °· Plan to have someone take you home after the procedure. Follow instructions about when you can drive or return to work. °SEEK MEDICAL CARE IF: °· You get light-headed when standing up. °· You have drainage (other than a small amount of blood on the dressing). °· You have chills. °· You have a fever. °· You have redness, warmth, swelling, or pain at the insertion site. °SEEK IMMEDIATE MEDICAL CARE IF:  °· You develop chest pain or shortness of breath, feel faint, or pass out. °· You have bleeding, swelling larger than a walnut, or drainage from the  catheter insertion site. °· You develop pain, discoloration, coldness, or severe bruising in the leg or arm that held the catheter. °· You develop bleeding from any other place, such as the bowels. You may see bright red blood in your urine or stools, or your stools may appear black and tarry. °· You have heavy bleeding from the site. If this happens, hold pressure on the site. °MAKE SURE YOU: °· Understand these instructions. °· Will watch your condition. °· Will get help right away if you are not doing well or get worse. °Document Released: 05/30/2005 Document Revised: 03/28/2014 Document Reviewed: 04/05/2013 °Exit[employer]® Patient Information ©2015 [employer]. This information is not intended to replace advice given to you by your health care provider. Make sure you discuss any questions you have with your health care provider. °Angiogram, Care After °Refer to this sheet in the next few weeks. These instructions provide you with information on caring for yourself after your procedure. Your health care provider may also give you more specific instructions. Your treatment has been planned according to current medical practices, but problems sometimes occur. Call your health care provider if you have any problems or questions after your procedure.  °WHAT TO EXPECT AFTER THE PROCEDURE °After your procedure, it is typical to have the following sensations: °· Minor discomfort or tenderness and a small bump at the catheter insertion site. The bump should usually decrease in size and tenderness within 1 to 2 weeks. °· Any bruising will usually   fade within 2 to 4 weeks. °HOME CARE INSTRUCTIONS  °· You may need to keep taking blood thinners if they were prescribed for you. Take medicines only as directed by your health care provider. °· Do not apply powder or lotion to the site. °· Do not take baths, swim, or use a hot tub until your health care provider approves. °· You may shower 24 hours after the procedure. Remove the  bandage (dressing) and gently wash the site with plain soap and water. Gently pat the site dry. °· Inspect the site at least twice daily. °· Limit your activity for the first 48 hours. Do not bend, squat, or lift anything over 20 lb (9 kg) or as directed by your health care provider. °· Plan to have someone take you home after the procedure. Follow instructions about when you can drive or return to work. °SEEK MEDICAL CARE IF: °· You get light-headed when standing up. °· You have drainage (other than a small amount of blood on the dressing). °· You have chills. °· You have a fever. °· You have redness, warmth, swelling, or pain at the insertion site. °SEEK IMMEDIATE MEDICAL CARE IF:  °· You develop chest pain or shortness of breath, feel faint, or pass out. °· You have bleeding, swelling larger than a walnut, or drainage from the catheter insertion site. °· You develop pain, discoloration, coldness, or severe bruising in the leg or arm that held the catheter. °· You develop bleeding from any other place, such as the bowels. You may see bright red blood in your urine or stools, or your stools may appear black and tarry. °· You have heavy bleeding from the site. If this happens, hold pressure on the site. °MAKE SURE YOU: °· Understand these instructions. °· Will watch your condition. °· Will get help right away if you are not doing well or get worse. °Document Released: 05/30/2005 Document Revised: 03/28/2014 Document Reviewed: 04/05/2013 °Exit[employer]® Patient Information ©2015 [employer]. This information is not intended to replace advice given to you by your health care provider. Make sure you discuss any questions you have with your health care provider. ° °

## 2015-07-25 NOTE — Interval H&P Note (Signed)
History and Physical Interval Note:  07/25/2015 10:03 AM  Andrea Oconnell  has presented today for surgery, with the diagnosis of renal vascular hp  The various methods of treatment have been discussed with the patient and family. After consideration of risks, benefits and other options for treatment, the patient has consented to  Procedure(s): Renal Angiography (N/A) and possible PTA as a surgical intervention .  The patient's history has been reviewed, patient examined, no change in status, stable for surgery.  I have reviewed the patient's chart and labs.  Questions were answered to the patient's satisfaction.     Yates Decamp

## 2015-07-26 ENCOUNTER — Encounter (HOSPITAL_COMMUNITY): Payer: Self-pay | Admitting: Cardiology

## 2017-06-09 DIAGNOSIS — I10 Essential (primary) hypertension: Secondary | ICD-10-CM | POA: Diagnosis not present

## 2017-06-09 DIAGNOSIS — E785 Hyperlipidemia, unspecified: Secondary | ICD-10-CM | POA: Diagnosis not present

## 2017-06-09 DIAGNOSIS — N183 Chronic kidney disease, stage 3 (moderate): Secondary | ICD-10-CM | POA: Diagnosis not present

## 2017-06-09 DIAGNOSIS — E1165 Type 2 diabetes mellitus with hyperglycemia: Secondary | ICD-10-CM | POA: Diagnosis not present

## 2017-06-23 DIAGNOSIS — N183 Chronic kidney disease, stage 3 (moderate): Secondary | ICD-10-CM | POA: Diagnosis not present

## 2017-06-23 DIAGNOSIS — E875 Hyperkalemia: Secondary | ICD-10-CM | POA: Diagnosis not present

## 2017-06-23 DIAGNOSIS — I10 Essential (primary) hypertension: Secondary | ICD-10-CM | POA: Diagnosis not present

## 2017-06-23 DIAGNOSIS — E785 Hyperlipidemia, unspecified: Secondary | ICD-10-CM | POA: Diagnosis not present

## 2017-06-23 DIAGNOSIS — E1165 Type 2 diabetes mellitus with hyperglycemia: Secondary | ICD-10-CM | POA: Diagnosis not present

## 2017-06-30 DIAGNOSIS — I10 Essential (primary) hypertension: Secondary | ICD-10-CM | POA: Diagnosis not present

## 2017-06-30 DIAGNOSIS — E785 Hyperlipidemia, unspecified: Secondary | ICD-10-CM | POA: Diagnosis not present

## 2017-06-30 DIAGNOSIS — E1165 Type 2 diabetes mellitus with hyperglycemia: Secondary | ICD-10-CM | POA: Diagnosis not present

## 2017-06-30 DIAGNOSIS — N183 Chronic kidney disease, stage 3 (moderate): Secondary | ICD-10-CM | POA: Diagnosis not present

## 2017-12-17 DIAGNOSIS — I10 Essential (primary) hypertension: Secondary | ICD-10-CM | POA: Diagnosis not present

## 2017-12-17 DIAGNOSIS — X118XXA Contact with other hot tap-water, initial encounter: Secondary | ICD-10-CM | POA: Diagnosis not present

## 2017-12-17 DIAGNOSIS — E7800 Pure hypercholesterolemia, unspecified: Secondary | ICD-10-CM | POA: Diagnosis not present

## 2017-12-17 DIAGNOSIS — B999 Unspecified infectious disease: Secondary | ICD-10-CM | POA: Diagnosis not present

## 2017-12-17 DIAGNOSIS — Z7409 Other reduced mobility: Secondary | ICD-10-CM | POA: Diagnosis not present

## 2017-12-17 DIAGNOSIS — E11621 Type 2 diabetes mellitus with foot ulcer: Secondary | ICD-10-CM | POA: Diagnosis not present

## 2017-12-17 DIAGNOSIS — H409 Unspecified glaucoma: Secondary | ICD-10-CM | POA: Diagnosis not present

## 2017-12-17 DIAGNOSIS — L0390 Cellulitis, unspecified: Secondary | ICD-10-CM | POA: Diagnosis not present

## 2017-12-17 DIAGNOSIS — Z91128 Patient's intentional underdosing of medication regimen for other reason: Secondary | ICD-10-CM | POA: Diagnosis not present

## 2017-12-17 DIAGNOSIS — T25021A Burn of unspecified degree of right foot, initial encounter: Secondary | ICD-10-CM | POA: Diagnosis not present

## 2017-12-17 DIAGNOSIS — R2681 Unsteadiness on feet: Secondary | ICD-10-CM | POA: Diagnosis not present

## 2017-12-17 DIAGNOSIS — L03115 Cellulitis of right lower limb: Secondary | ICD-10-CM | POA: Diagnosis not present

## 2017-12-17 DIAGNOSIS — R262 Difficulty in walking, not elsewhere classified: Secondary | ICD-10-CM | POA: Diagnosis not present

## 2017-12-17 DIAGNOSIS — E11319 Type 2 diabetes mellitus with unspecified diabetic retinopathy without macular edema: Secondary | ICD-10-CM | POA: Diagnosis not present

## 2017-12-17 DIAGNOSIS — L089 Local infection of the skin and subcutaneous tissue, unspecified: Secondary | ICD-10-CM | POA: Diagnosis not present

## 2017-12-17 DIAGNOSIS — B9561 Methicillin susceptible Staphylococcus aureus infection as the cause of diseases classified elsewhere: Secondary | ICD-10-CM | POA: Diagnosis not present

## 2017-12-17 DIAGNOSIS — E871 Hypo-osmolality and hyponatremia: Secondary | ICD-10-CM | POA: Diagnosis not present

## 2017-12-17 DIAGNOSIS — Z681 Body mass index (BMI) 19 or less, adult: Secondary | ICD-10-CM | POA: Diagnosis not present

## 2017-12-17 DIAGNOSIS — H44513 Absolute glaucoma, bilateral: Secondary | ICD-10-CM | POA: Diagnosis not present

## 2017-12-17 DIAGNOSIS — R278 Other lack of coordination: Secondary | ICD-10-CM | POA: Diagnosis not present

## 2017-12-17 DIAGNOSIS — Z794 Long term (current) use of insulin: Secondary | ICD-10-CM | POA: Diagnosis not present

## 2017-12-17 DIAGNOSIS — L0311 Cellulitis of other parts of limb: Secondary | ICD-10-CM | POA: Diagnosis not present

## 2017-12-17 DIAGNOSIS — T310 Burns involving less than 10% of body surface: Secondary | ICD-10-CM | POA: Diagnosis not present

## 2017-12-17 DIAGNOSIS — T25221A Burn of second degree of right foot, initial encounter: Secondary | ICD-10-CM | POA: Diagnosis not present

## 2017-12-17 DIAGNOSIS — E1165 Type 2 diabetes mellitus with hyperglycemia: Secondary | ICD-10-CM | POA: Diagnosis not present

## 2017-12-17 DIAGNOSIS — N179 Acute kidney failure, unspecified: Secondary | ICD-10-CM | POA: Diagnosis not present

## 2017-12-17 DIAGNOSIS — R739 Hyperglycemia, unspecified: Secondary | ICD-10-CM | POA: Diagnosis not present

## 2017-12-17 DIAGNOSIS — E11628 Type 2 diabetes mellitus with other skin complications: Secondary | ICD-10-CM | POA: Diagnosis not present

## 2017-12-17 DIAGNOSIS — L97519 Non-pressure chronic ulcer of other part of right foot with unspecified severity: Secondary | ICD-10-CM | POA: Diagnosis not present

## 2017-12-17 DIAGNOSIS — T383X6A Underdosing of insulin and oral hypoglycemic [antidiabetic] drugs, initial encounter: Secondary | ICD-10-CM | POA: Diagnosis not present

## 2017-12-17 DIAGNOSIS — E440 Moderate protein-calorie malnutrition: Secondary | ICD-10-CM | POA: Diagnosis not present

## 2017-12-17 DIAGNOSIS — Y999 Unspecified external cause status: Secondary | ICD-10-CM | POA: Diagnosis not present

## 2017-12-17 DIAGNOSIS — M7989 Other specified soft tissue disorders: Secondary | ICD-10-CM | POA: Diagnosis not present

## 2017-12-17 DIAGNOSIS — R488 Other symbolic dysfunctions: Secondary | ICD-10-CM | POA: Diagnosis not present

## 2017-12-19 LAB — HEPATIC FUNCTION PANEL
Alkaline Phosphatase: 77 (ref 25–125)
BILIRUBIN, TOTAL: 0.2

## 2017-12-22 LAB — CBC AND DIFFERENTIAL
HCT: 28 — AB (ref 36–46)
Hemoglobin: 9.3 — AB (ref 12.0–16.0)
PLATELETS: 434 — AB (ref 150–399)
WBC: 6.7

## 2017-12-22 LAB — BASIC METABOLIC PANEL
BUN: 28 — AB (ref 4–21)
CREATININE: 1.1 (ref <=1.1)
POTASSIUM: 4.2 (ref 3.4–5.3)
Sodium: 136 — AB (ref 137–147)

## 2017-12-22 LAB — HEPATIC FUNCTION PANEL
ALT: 8 (ref 7–35)
AST: 12 — AB (ref 13–35)

## 2017-12-24 DIAGNOSIS — N179 Acute kidney failure, unspecified: Secondary | ICD-10-CM | POA: Diagnosis not present

## 2017-12-24 DIAGNOSIS — E11628 Type 2 diabetes mellitus with other skin complications: Secondary | ICD-10-CM | POA: Diagnosis not present

## 2017-12-24 DIAGNOSIS — T25021A Burn of unspecified degree of right foot, initial encounter: Secondary | ICD-10-CM | POA: Diagnosis not present

## 2017-12-24 DIAGNOSIS — T383X6A Underdosing of insulin and oral hypoglycemic [antidiabetic] drugs, initial encounter: Secondary | ICD-10-CM | POA: Diagnosis not present

## 2017-12-24 DIAGNOSIS — E119 Type 2 diabetes mellitus without complications: Secondary | ICD-10-CM | POA: Diagnosis not present

## 2017-12-24 DIAGNOSIS — H547 Unspecified visual loss: Secondary | ICD-10-CM | POA: Diagnosis not present

## 2017-12-24 DIAGNOSIS — A4901 Methicillin susceptible Staphylococcus aureus infection, unspecified site: Secondary | ICD-10-CM | POA: Diagnosis not present

## 2017-12-24 DIAGNOSIS — L03115 Cellulitis of right lower limb: Secondary | ICD-10-CM | POA: Diagnosis not present

## 2017-12-24 DIAGNOSIS — S91301A Unspecified open wound, right foot, initial encounter: Secondary | ICD-10-CM | POA: Diagnosis not present

## 2017-12-24 DIAGNOSIS — S91301D Unspecified open wound, right foot, subsequent encounter: Secondary | ICD-10-CM | POA: Diagnosis not present

## 2017-12-24 DIAGNOSIS — B9561 Methicillin susceptible Staphylococcus aureus infection as the cause of diseases classified elsewhere: Secondary | ICD-10-CM | POA: Diagnosis not present

## 2017-12-24 DIAGNOSIS — R2681 Unsteadiness on feet: Secondary | ICD-10-CM | POA: Diagnosis not present

## 2017-12-24 DIAGNOSIS — I10 Essential (primary) hypertension: Secondary | ICD-10-CM | POA: Diagnosis not present

## 2017-12-24 DIAGNOSIS — R488 Other symbolic dysfunctions: Secondary | ICD-10-CM | POA: Diagnosis not present

## 2017-12-24 DIAGNOSIS — Z91128 Patient's intentional underdosing of medication regimen for other reason: Secondary | ICD-10-CM | POA: Diagnosis not present

## 2017-12-24 DIAGNOSIS — E440 Moderate protein-calorie malnutrition: Secondary | ICD-10-CM | POA: Diagnosis not present

## 2017-12-24 DIAGNOSIS — L03119 Cellulitis of unspecified part of limb: Secondary | ICD-10-CM | POA: Diagnosis not present

## 2017-12-24 DIAGNOSIS — N183 Chronic kidney disease, stage 3 (moderate): Secondary | ICD-10-CM | POA: Diagnosis not present

## 2017-12-24 DIAGNOSIS — R262 Difficulty in walking, not elsewhere classified: Secondary | ICD-10-CM | POA: Diagnosis not present

## 2017-12-24 DIAGNOSIS — L0390 Cellulitis, unspecified: Secondary | ICD-10-CM | POA: Diagnosis not present

## 2017-12-24 DIAGNOSIS — E11319 Type 2 diabetes mellitus with unspecified diabetic retinopathy without macular edema: Secondary | ICD-10-CM | POA: Diagnosis not present

## 2017-12-24 DIAGNOSIS — E871 Hypo-osmolality and hyponatremia: Secondary | ICD-10-CM | POA: Diagnosis not present

## 2017-12-24 DIAGNOSIS — I150 Renovascular hypertension: Secondary | ICD-10-CM | POA: Diagnosis not present

## 2017-12-24 DIAGNOSIS — B999 Unspecified infectious disease: Secondary | ICD-10-CM | POA: Diagnosis not present

## 2017-12-24 DIAGNOSIS — R278 Other lack of coordination: Secondary | ICD-10-CM | POA: Diagnosis not present

## 2017-12-24 DIAGNOSIS — T25221D Burn of second degree of right foot, subsequent encounter: Secondary | ICD-10-CM | POA: Diagnosis not present

## 2017-12-24 DIAGNOSIS — E1165 Type 2 diabetes mellitus with hyperglycemia: Secondary | ICD-10-CM | POA: Diagnosis not present

## 2017-12-24 DIAGNOSIS — L0311 Cellulitis of other parts of limb: Secondary | ICD-10-CM | POA: Diagnosis not present

## 2017-12-24 DIAGNOSIS — Z794 Long term (current) use of insulin: Secondary | ICD-10-CM | POA: Diagnosis not present

## 2017-12-26 ENCOUNTER — Non-Acute Institutional Stay (SKILLED_NURSING_FACILITY): Payer: Medicare HMO | Admitting: Internal Medicine

## 2017-12-26 ENCOUNTER — Encounter: Payer: Self-pay | Admitting: *Deleted

## 2017-12-26 ENCOUNTER — Encounter: Payer: Self-pay | Admitting: Internal Medicine

## 2017-12-26 DIAGNOSIS — T25221D Burn of second degree of right foot, subsequent encounter: Secondary | ICD-10-CM

## 2017-12-26 DIAGNOSIS — H547 Unspecified visual loss: Secondary | ICD-10-CM

## 2017-12-26 DIAGNOSIS — E440 Moderate protein-calorie malnutrition: Secondary | ICD-10-CM | POA: Diagnosis not present

## 2017-12-26 DIAGNOSIS — N179 Acute kidney failure, unspecified: Secondary | ICD-10-CM | POA: Diagnosis not present

## 2017-12-26 DIAGNOSIS — A4901 Methicillin susceptible Staphylococcus aureus infection, unspecified site: Secondary | ICD-10-CM | POA: Diagnosis not present

## 2017-12-26 DIAGNOSIS — E119 Type 2 diabetes mellitus without complications: Secondary | ICD-10-CM | POA: Diagnosis not present

## 2017-12-26 DIAGNOSIS — L03119 Cellulitis of unspecified part of limb: Secondary | ICD-10-CM

## 2017-12-26 DIAGNOSIS — I150 Renovascular hypertension: Secondary | ICD-10-CM

## 2017-12-26 DIAGNOSIS — E871 Hypo-osmolality and hyponatremia: Secondary | ICD-10-CM | POA: Diagnosis not present

## 2017-12-26 NOTE — Progress Notes (Signed)
: Provider:  Margit HanksAnne D Aniken Monestime MD Location:  [facility] Room Number: 114-P Place of Service:  SNF (31)  PCP: unknown   No emergency contact information on file.     Allergies: Patient has no known allergies.  Chief Complaint  Patient presents with  . New Admit To SNF    HPI: Patient is 62 y.o. female with hypertension, diabetes mellitus type 2, hypercholesterolemia, and glaucoma both eyes with blindness who presented to [facility] ED with a blister on her right foot from where she had dropped boiling water on it several weeks prior. Patient is blind and lives by herself. In the ED she was found to be hyponatremic and have acute kidney injury and also hyperglycemic. She will she has not been taking her medications regularly. She was admitted to [facility] from 12/23-33 wound cultures from her foot showed MSSA and Keflex was started. Patient was treated with IV fluids and her acute kidney injury and hyponatremia resolved. Patient is admitted to skilled nursing facility for OT/PT. While at skilled nursing facility patient will be followed for hypertension treated with Diovan HCT, diabetes mellitus treated with insulin and hyperlipidemia treated with Crestor.  Past Medical History:  Diagnosis Date  . Diabetes mellitus without complication (HCC)   . Glaucoma   . Hypertension   . Pseudophakia of both eyes   . Renal artery stenosis (HCC)    per Dr. Anselm JunglingGhangi  . Vitreous hemorrhage of right eye [facility](HCC)     Past Surgical History:  Procedure Laterality Date  . CARDIAC CATHETERIZATION    . EYE SURGERY Bilateral    Cataract extraction  . TONSILLECTOMY      Allergies as of 12/26/2017   No Known Allergies     Medication List        Accurate as of 12/26/17 11:59 PM. Always use your most recent med list.          Amlodipine-Valsartan-HCTZ 5-160-12.5 MG Tabs Take 1 tablet by mouth daily.   aspirin EC 81 MG tablet Take 81 mg by  mouth daily.   benzonatate 100 MG capsule Commonly known as:  TESSALON Take 100 mg by mouth every 8 (eight) hours as needed for cough.   brimonidine 0.2 % ophthalmic solution Commonly known as:  ALPHAGAN Place 1 drop into both eyes 3 (three) times daily.   cephALEXin 500 MG capsule Commonly known as:  KEFLEX Take 500 mg by mouth 3 (three) times daily. For right foot wound.   HYDROcodone-acetaminophen 5-325 MG tablet Commonly known as:  NORCO/VICODIN Take 1 tablet by mouth every 8 (eight) hours as needed for moderate pain.   LANTUS SOLOSTAR 100 UNIT/ML Solostar Pen Generic drug:  Insulin Glargine Inject 12 Units into the skin every morning.   LANTUS SOLOSTAR 100 UNIT/ML Solostar Pen Generic drug:  Insulin Glargine Inject 15 Units into the skin daily at 10 pm.   nitroGLYCERIN 0.4 MG SL tablet Commonly known as:  NITROSTAT Place 0.4 mg under the tongue every 5 (five) minutes as needed for chest pain (Min. of 3 doses, notify  MD if no relief.).   ONE-A-DAY WOMENS 50 PLUS PO Take 1 tablet by mouth daily.   rosuvastatin 20 MG tablet Commonly known as:  CRESTOR Take 20 mg by mouth daily.       No orders of the defined types were placed in this encounter.    There is no immunization history on file for this patient.  Social History   Tobacco Use  . Smoking status: Never Smoker  . Smokeless tobacco: Never Used  Substance Use Topics  . Alcohol use: No    Frequency: Never    Family history is   Family History  Problem Relation Age of Onset  . Diabetes Mother   . Heart disease Mother   . Cataracts Sister   . Diabetes Sister   . Hypertension Sister   . Diabetes Son   . Hypertension Son       Review of Systems  DATA OBTAINED: from patient, nurse GENERAL:  no fevers, fatigue, appetite changes SKIN: Right foot pain from burn EYES: No eye pain, redness, discharge EARS: No earache, tinnitus, change in hearing NOSE: No congestion, drainage or bleeding    MOUTH/THROAT: No mouth or tooth pain, No sore throat RESPIRATORY: No cough, wheezing, SOB CARDIAC: No chest pain, palpitations, lower extremity edema  GI: No abdominal pain, No N/V/D or constipation, No heartburn or reflux  GU: No dysuria, frequency or urgency, or incontinence  MUSCULOSKELETAL: No unrelieved bone/joint pain NEUROLOGIC: No headache, dizziness or focal weakness PSYCHIATRIC: No c/o anxiety or sadness   Vitals:   12/26/17 1109  BP: (!) 180/79  Pulse: 66  Resp: 16  Temp: 98.5 F (36.9 C)    SpO2 Readings from Last 1 Encounters:  No data found for SpO2   Body mass index is 18.17 kg/m.     Physical Exam  GENERAL APPEARANCE: Alert, conversant,  No acute distress.  SKIN: Second-degree wound/burn dorsum of right foot, no surrounding redness or erythema HEAD: Normocephalic, atraumatic  EYES: Conjunctiva/lids clear. Patient is blind  EARS: External exam WNL, canals clear. Hearing grossly normal.  NOSE: No deformity or discharge.  MOUTH/THROAT: Lips w/o lesions  RESPIRATORY: Breathing is even, unlabored. Lung sounds are clear   CARDIOVASCULAR: Heart RRR no murmurs, rubs or gallops. No peripheral edema.   GASTROINTESTINAL: Abdomen is soft, non-tender, not distended w/ normal bowel sounds. GENITOURINARY: Bladder non tender, not distended  MUSCULOSKELETAL: No abnormal joints or musculature NEUROLOGIC:  Cranial nerves 2-12 grossly intact. Moves all extremities  PSYCHIATRIC: Mood and affect appropriate to situation, no behavioral issues  Patient Active Problem List   Diagnosis Date Noted  . MSSA (methicillin susceptible Staphylococcus aureus) infection 12/28/2017  . Cellulitis of foot 12/28/2017  . Second degree burn of foot 12/28/2017  . Acute kidney injury (HCC) 12/28/2017  . Hyponatremia 12/28/2017  . Diabetes mellitus without complication (HCC) 12/28/2017  . Hypertension 12/28/2017  . Protein-calorie malnutrition (HCC) 12/28/2017  . Blindness 12/28/2017       Labs reviewed: Basic Metabolic Panel:    Component Value Date/Time   NA 136 (A) 12/22/2017   K 4.2 12/22/2017   BUN 28 (A) 12/22/2017   CREATININE 1.1 12/22/2017   AST 12 (A) 12/22/2017   ALT 8 12/22/2017   ALKPHOS 77 12/19/2017    Recent Labs    12/22/17  NA 136*  K 4.2  BUN 28*  CREATININE 1.1   Liver Function Tests: Recent Labs    12/19/17 12/22/17  AST  --  12*  ALT  --  8  ALKPHOS 77  --    No results for input(s): LIPASE, AMYLASE in the last 8760 hours. No results for input(s): AMMONIA in the last 8760 hours. CBC: Recent Labs    12/22/17  WBC 6.7  HGB 9.3*  HCT 28*  PLT 434*   Lipid No results for input(s): CHOL, HDL, LDLCALC, TRIG in the last 8760  hours.  Cardiac Enzymes: No results for input(s): CKTOTAL, CKMB, CKMBINDEX, TROPONINI in the last 8760 hours. BNP: No results for input(s): BNP in the last 8760 hours. No results found for: MICROALBUR No results found for: HGBA1C No results found for: TSH No results found for: VITAMINB12 No results found for: FOLATE No results found for: IRON, TIBC, FERRITIN  Imaging and Procedures obtained prior to SNF admission: Patient was never admitted.   Not all labs, radiology exams or other studies done during hospitalization come through on my EPIC note; however they are reviewed by me.    Assessment and Plan  Cellulitis right foot/second-degree burn right foot-grew out MSSA and was treated with Keflex, to complete a 10 day course SNF - admitted for OT/PT; continue Keflex thousand milligrams 3 times a day for 4 more days; wound care daily for wound care nurse; will order pain medication for 30 minutes prior to dressing change  AK/hyponatremia-resolved with IV fluids SNF - will follow-up BMP  Diabetes mellitus type 2-patient had not been taking her medications prior to hospitalization SNF - patient was started on Lantus insulin 15 units nightly and 12 units every morning; will monitor  CBG  Hypertension SNF - not well-controlled as hospitalist took patient off of her Norvasc for Apresoline for reasons not clear-she was taken off of Cozaar 50 but that was because of her acute kidney injury SNF - continuing Diovan HCT 160-12.5 by mouth daily and will restart her Norvasc 10 mg daily for now and add hydralazine and blood pressures remain high  More protein calorie malnutrition SNF - will be on protein supplements while at skilled nursing facility  Bilateral blindness SNF - supportive care   Time spent greater than 45 minutes;> 50% of time with patient was spent reviewing records, labs, tests and studies, counseling and developing plan of care  Thurston Hole D. Lyn Hollingshead  MD

## 2017-12-28 ENCOUNTER — Encounter: Payer: Self-pay | Admitting: Internal Medicine

## 2017-12-28 DIAGNOSIS — L03119 Cellulitis of unspecified part of limb: Secondary | ICD-10-CM | POA: Insufficient documentation

## 2017-12-28 DIAGNOSIS — I10 Essential (primary) hypertension: Secondary | ICD-10-CM | POA: Insufficient documentation

## 2017-12-28 DIAGNOSIS — H547 Unspecified visual loss: Secondary | ICD-10-CM | POA: Insufficient documentation

## 2017-12-28 DIAGNOSIS — E119 Type 2 diabetes mellitus without complications: Secondary | ICD-10-CM | POA: Insufficient documentation

## 2017-12-28 DIAGNOSIS — A4901 Methicillin susceptible Staphylococcus aureus infection, unspecified site: Secondary | ICD-10-CM | POA: Insufficient documentation

## 2017-12-28 DIAGNOSIS — E46 Unspecified protein-calorie malnutrition: Secondary | ICD-10-CM | POA: Insufficient documentation

## 2017-12-28 DIAGNOSIS — N179 Acute kidney failure, unspecified: Secondary | ICD-10-CM | POA: Insufficient documentation

## 2017-12-28 DIAGNOSIS — E871 Hypo-osmolality and hyponatremia: Secondary | ICD-10-CM | POA: Insufficient documentation

## 2017-12-28 DIAGNOSIS — T25229A Burn of second degree of unspecified foot, initial encounter: Secondary | ICD-10-CM | POA: Insufficient documentation

## 2017-12-29 ENCOUNTER — Non-Acute Institutional Stay (SKILLED_NURSING_FACILITY): Payer: Medicare HMO | Admitting: Internal Medicine

## 2017-12-29 DIAGNOSIS — Z794 Long term (current) use of insulin: Secondary | ICD-10-CM | POA: Diagnosis not present

## 2017-12-29 DIAGNOSIS — E1165 Type 2 diabetes mellitus with hyperglycemia: Secondary | ICD-10-CM | POA: Diagnosis not present

## 2017-12-30 ENCOUNTER — Encounter (HOSPITAL_COMMUNITY): Payer: Self-pay | Admitting: Cardiology

## 2018-01-03 ENCOUNTER — Encounter: Payer: Self-pay | Admitting: Internal Medicine

## 2018-01-03 NOTE — Progress Notes (Signed)
Location:  [facility]   Place of Service:  SNF (31)  Margit HanksAlexander, Anne D, MD  Patient Care Team: Margit HanksAlexander, Anne D, MD as PCP - General (Internal Medicine) Jackie Plumsei-Bonsu, George, MD  Extended Emergency Contact Information Primary Emergency Contact: Gerrit FriendsGainey,Steven  [place] Home Phone: [phone] Relation: Son    Allergies: Patient has no known allergies.  Chief Complaint  Patient presents with  . Acute Visit    High blood sugar    HPI: Patient is 62 y.o. female who nursing asked me to see because her blood sugars have been running consistently high since being admitted from the hospital. Lisinopril family told nursing that patient was normally on 25 units of Lantus at night. Patient was discharged from the hospital on 12 units of Lantus in the morning and 15 units of Lantus at night. Patient is known to be noncompliant with her diet and snacks all the time.  Past Medical History:  Diagnosis Date  . Blindness   . CKD (chronic kidney disease) stage 3, GFR 30-59 ml/min (HCC)   . Diabetes mellitus   . Diabetes mellitus without complication (HCC)   . Glaucoma   . Hyperlipemia   . Hyperlipidemia   . Hypertension   . Joint pain   . Pseudophakia of both eyes   . Renal artery stenosis (HCC)    per Dr. Anselm JunglingGhangi  . Renal insufficiency   . Vitreous hemorrhage of right eye [facility](HCC)     Past Surgical History:  Procedure Laterality Date  . CARDIAC CATHETERIZATION    . CAROTID ENDARTERECTOMY    . CATARACT EXTRACTION    . EYE SURGERY Bilateral    Cataract extraction  . PERIPHERAL VASCULAR CATHETERIZATION N/A 07/25/2015   Procedure: Renal Angiography;  Surgeon: Yates DecampJay Ganji, MD;  [facility] CV LAB;  Service: Cardiovascular;  Laterality: N/A;  . RENAL ARTERY STENT    . TONSILLECTOMY      Allergies as of 12/29/2017   No Known Allergies     Medication List        Accurate as of 12/29/17 11:59 PM. Always use your most recent med list.          amLODipine 10 MG tablet Commonly known as:  NORVASC Take 10 mg by mouth daily.   Amlodipine-Valsartan-HCTZ 5-160-12.5 MG Tabs Take 1 tablet by mouth daily.   aspirin EC 81 MG tablet Take 81 mg by mouth daily.   aspirin EC 81 MG tablet Take 81 mg by mouth daily.   benzonatate 100 MG capsule Commonly known as:  TESSALON Take 100 mg by mouth every 8 (eight) hours as needed for cough.   brimonidine 0.2 % ophthalmic solution Commonly known as:  ALPHAGAN Place 1 drop into both eyes 3 (three) times daily.   cephALEXin 500 MG capsule Commonly known as:  KEFLEX Take 500 mg by mouth 3 (three) times daily. For right foot wound.   glipiZIDE-metformin 5-500 MG tablet Commonly known as:  METAGLIP Take 1 tablet by mouth 2 (two) times daily before a meal.   hydrALAZINE 50 MG tablet Commonly known as:  APRESOLINE Take 1 tablet (50 mg total) by mouth 3 (three) times daily.   HYDROcodone-acetaminophen 5-325 MG tablet Commonly known as:  NORCO/VICODIN Take 1 tablet by mouth every 8 (eight) hours as needed for moderate pain.   LANTUS SOLOSTAR 100 UNIT/ML Solostar Pen Generic drug:  Insulin Glargine Inject 12 Units into the skin every morning.   LANTUS SOLOSTAR 100 UNIT/ML  Solostar Pen Generic drug:  Insulin Glargine Inject 15 Units into the skin daily at 10 pm.   insulin glargine 100 UNIT/ML injection Commonly known as:  LANTUS Inject 20 Units into the skin at bedtime.   metoprolol tartrate 50 MG tablet Commonly known as:  LOPRESSOR Take 50 mg by mouth 2 (two) times daily.   multivitamin capsule Take 1 capsule by mouth daily.   nitroGLYCERIN 0.4 MG SL tablet Commonly known as:  NITROSTAT Place 0.4 mg under the tongue every 5 (five) minutes as needed for chest pain.   nitroGLYCERIN 0.4 MG SL tablet Commonly known as:  NITROSTAT Place 0.4 mg under the tongue every 5 (five) minutes as needed for chest pain (Min. of 3 doses, notify  MD if no relief.).   ONE-A-DAY WOMENS 50  PLUS PO Take 1 tablet by mouth daily.   rosuvastatin 20 MG tablet Commonly known as:  CRESTOR Take 20 mg by mouth daily.   rosuvastatin 20 MG tablet Commonly known as:  CRESTOR Take 20 mg by mouth at bedtime.       No orders of the defined types were placed in this encounter.    There is no immunization history on file for this patient.  Social History   Tobacco Use  . Smoking status: Never Smoker  . Smokeless tobacco: Never Used  Substance Use Topics  . Alcohol use: No    Frequency: Never    Review of Systems  DATA OBTAINED: from patient, nurse GENERAL:  no fevers, fatigue, appetite changes SKIN: No itching, rash HEENT: No complaint RESPIRATORY: No cough, wheezing, SOB CARDIAC: No chest pain, palpitations, lower extremity edema  GI: No abdominal pain, No N/V/D or constipation, No heartburn or reflux  GU: No dysuria, frequency or urgency, or incontinence  MUSCULOSKELETAL: No unrelieved bone/joint pain NEUROLOGIC: No headache, dizziness  PSYCHIATRIC: No overt anxiety or sadness  Vitals:   01/03/18 1458  BP: 116/65  Pulse: 90  Resp: 18  Temp: (!) 97.3 F (36.3 C)   There is no height or weight on file to calculate BMI. Physical Exam  GENERAL APPEARANCE: Alert, conversant, No acute distress  SKIN: Dressing right foot HEENT: Unremarkable except patient is blind RESPIRATORY: Breathing is even, unlabored. Lung sounds are clear   CARDIOVASCULAR: Heart RRR no murmurs, rubs or gallops. No peripheral edema  GASTROINTESTINAL: Abdomen is soft, non-tender, not distended w/ normal bowel sounds.  GENITOURINARY: Bladder non tender, not distended  MUSCULOSKELETAL: No abnormal joints or musculature NEUROLOGIC: Cranial nerves 2-12 grossly intact. Moves all extremities PSYCHIATRIC: Mood and affect appropriate to situation, no behavioral issues  Patient Active Problem List   Diagnosis Date Noted  . MSSA (methicillin susceptible Staphylococcus aureus) infection  12/28/2017  . Cellulitis of foot 12/28/2017  . Second degree burn of foot 12/28/2017  . Acute kidney injury (HCC) 12/28/2017  . Hyponatremia 12/28/2017  . Diabetes mellitus without complication (HCC) 12/28/2017  . Hypertension 12/28/2017  . Protein-calorie malnutrition (HCC) 12/28/2017  . Blindness 12/28/2017  . Diabetes (HCC) 03/09/2014  . Onychomycosis 03/09/2014  . Renal artery stenosis (HCC) 11/30/2010    CMP     Component Value Date/Time   NA 136 (A) 12/22/2017   K 4.2 12/22/2017   CL 103 03/02/2015 1150   CO2 28 03/02/2015 1150   GLUCOSE 96 03/02/2015 1150   BUN 28 (A) 12/22/2017   CREATININE 1.1 12/22/2017   CREATININE 1.23 (H) 03/02/2015 1150   CALCIUM 10.2 03/02/2015 1150   PROT 7.2 11/01/2010 1007  ALBUMIN 4.0 11/01/2010 1007   AST 12 (A) 12/22/2017   ALT 8 12/22/2017   ALKPHOS 77 12/19/2017   BILITOT 0.5 11/01/2010 1007   GFRNONAA 47 (L) 03/02/2015 1150   GFRAA 54 (L) 03/02/2015 1150   Recent Labs    12/22/17  NA 136*  K 4.2  BUN 28*  CREATININE 1.1   Recent Labs    12/19/17 12/22/17  AST  --  12*  ALT  --  8  ALKPHOS 77  --    Recent Labs    12/22/17  WBC 6.7  HGB 9.3*  HCT 28*  PLT 434*   No results for input(s): CHOL, LDLCALC, TRIG in the last 8760 hours.  Invalid input(s): HCL No results found for: MICROALBUR No results found for: TSH No results found for: HGBA1C No results found for: CHOL, HDL, LDLCALC, LDLDIRECT, TRIG, CHOLHDL  Significant Diagnostic Results in last 30 days:  No results found.  Assessment and Plan  Hyperglycemia/diabetes mellitus type 2-I have looked through the blood sugars since patient arrived, patient's blood sugars in the morning are in the 2 and 300s so can certainly increase in insulin; also subcutaneous to get patient only once a day insulin-have written for Toujeo 25 every morning so I am sure the patient gets it; we'll monitor response    Merrilee Seashore, MD

## 2018-01-05 DIAGNOSIS — S91301A Unspecified open wound, right foot, initial encounter: Secondary | ICD-10-CM | POA: Diagnosis not present

## 2018-01-09 ENCOUNTER — Non-Acute Institutional Stay (SKILLED_NURSING_FACILITY): Payer: Medicare HMO | Admitting: Internal Medicine

## 2018-01-09 DIAGNOSIS — N183 Chronic kidney disease, stage 3 unspecified: Secondary | ICD-10-CM

## 2018-01-09 DIAGNOSIS — L03119 Cellulitis of unspecified part of limb: Secondary | ICD-10-CM

## 2018-01-09 DIAGNOSIS — E119 Type 2 diabetes mellitus without complications: Secondary | ICD-10-CM | POA: Diagnosis not present

## 2018-01-09 DIAGNOSIS — I150 Renovascular hypertension: Secondary | ICD-10-CM

## 2018-01-09 DIAGNOSIS — T25221D Burn of second degree of right foot, subsequent encounter: Secondary | ICD-10-CM

## 2018-01-09 DIAGNOSIS — A4901 Methicillin susceptible Staphylococcus aureus infection, unspecified site: Secondary | ICD-10-CM | POA: Diagnosis not present

## 2018-01-09 DIAGNOSIS — E871 Hypo-osmolality and hyponatremia: Secondary | ICD-10-CM | POA: Diagnosis not present

## 2018-01-09 DIAGNOSIS — N179 Acute kidney failure, unspecified: Secondary | ICD-10-CM | POA: Diagnosis not present

## 2018-01-09 DIAGNOSIS — E440 Moderate protein-calorie malnutrition: Secondary | ICD-10-CM

## 2018-01-09 DIAGNOSIS — H547 Unspecified visual loss: Secondary | ICD-10-CM

## 2018-01-09 NOTE — Progress Notes (Signed)
Location:  [facility]   Place of Service:     PCP: Margit Hanks, MD Patient Care Team: Margit Hanks, MD as PCP - General (Internal Medicine) Jackie Plum, MD  Extended Emergency Contact Information Primary Emergency Contact: Gerrit Friends [place] Home Phone: [phone] Relation: Son  No Known Allergies  Chief Complaint  Patient presents with  . Discharge Note    HPI:  62 y.o. female with hypertension, diabetes mellitus type 2, hypercholesterolemia, and glaucoma to both eyes with blindness who was admitted to [facility] from 12/23-2/3 where patient was treated for a bone/wound on the top of her right foot. Wound culture showed MSSA and Keflex was started. Patient was treated IV fluids and her acute kidney injury and hyponatremia resolved patient was admitted to skilled nursing facility for OT/PT and is now ready to be discharged to home.    Past Medical History:  Diagnosis Date  . Blindness   . CKD (chronic kidney disease) stage 3, GFR 30-59 ml/min (HCC)   . Diabetes mellitus   . Diabetes mellitus without complication (HCC)   . Glaucoma   . Hyperlipemia   . Hyperlipidemia   . Hypertension   . Joint pain   . Pseudophakia of both eyes   . Renal artery stenosis (HCC)    per Dr. Anselm Jungling  . Renal insufficiency   . Vitreous hemorrhage of right eye [facility])     Past Surgical History:  Procedure Laterality Date  . CARDIAC CATHETERIZATION    . CAROTID ENDARTERECTOMY    . CATARACT EXTRACTION    . EYE SURGERY Bilateral    Cataract extraction  . PERIPHERAL VASCULAR CATHETERIZATION N/A 07/25/2015   Procedure: Renal Angiography;  Surgeon: Yates Decamp, MD;  Location: [facility] INVASIVE CV LAB;  Service: Cardiovascular;  Laterality: N/A;  . RENAL ARTERY STENT    . TONSILLECTOMY       reports that  has never smoked. she has never used smokeless tobacco. She reports that she does not drink alcohol or use drugs. Social  History   Socioeconomic History  . Marital status: Unknown    Spouse name: Not on file  . Number of children: Not on file  . Years of education: Not on file  . Highest education level: Not on file  Social Needs  . Financial resource strain: Not on file  . Food insecurity - worry: Not on file  . Food insecurity - inability: Not on file  . Transportation needs - medical: Not on file  . Transportation needs - non-medical: Not on file  Occupational History  . Not on file  Tobacco Use  . Smoking status: Never Smoker  . Smokeless tobacco: Never Used  Substance and Sexual Activity  . Alcohol use: No    Frequency: Never  . Drug use: No  . Sexual activity: Not on file  Other Topics Concern  . Not on file  Social History Narrative   ** Merged History Encounter **        Pertinent  Health Maintenance Due  Topic Date Due  . HEMOGLOBIN A1C  Jan 20, 1956  . FOOT EXAM  04/11/1964  . OPHTHALMOLOGY EXAM  04/11/1964  . PAP SMEAR  04/12/1975  . MAMMOGRAM  04/11/2004  . COLONOSCOPY  04/11/2004  . INFLUENZA VACCINE  06/25/2017    Medications: Allergies as of 01/09/2018   No Known Allergies     Medication List        Accurate as  of 01/09/18 11:59 PM. Always use your most recent med list.          aspirin EC 81 MG tablet Take 81 mg by mouth daily.   brimonidine 0.2 % ophthalmic solution Commonly known as:  ALPHAGAN Place 1 drop into both eyes 3 (three) times daily.   HYDROcodone-acetaminophen 5-325 MG tablet Commonly known as:  NORCO/VICODIN Take 1 tablet by mouth every 8 (eight) hours.   nitroGLYCERIN 0.4 MG SL tablet Commonly known as:  NITROSTAT Place 0.4 mg under the tongue every 5 (five) minutes as needed for chest pain (Min. of 3 doses, notify  MD if no relief.).   ONE-A-DAY WOMENS 50 PLUS PO Take 1 tablet by mouth daily.   rosuvastatin 20 MG tablet Commonly known as:  CRESTOR Take 20 mg by mouth at bedtime.   TOUJEO MAX SOLOSTAR 300 UNIT/ML Sopn Generic drug:   Insulin Glargine Inject 25 Units into the skin every morning.   valsartan-hydrochlorothiazide 160-12.5 MG tablet Commonly known as:  DIOVAN-HCT Take 1 tablet by mouth daily.        Vitals:   01/11/18 1543  BP: 132/62  Pulse: 99  Resp: 16   There is no height or weight on file to calculate BMI.  Physical Exam  GENERAL APPEARANCE: Alert, conversant. No acute distress.  HEENT: Unremarkable. RESPIRATORY: Breathing is even, unlabored. Lung sounds are clear   CARDIOVASCULAR: Heart RRR no murmurs, rubs or gallops. No peripheral edema.  GASTROINTESTINAL: Abdomen is soft, non-tender, not distended w/ normal bowel sounds.  NEUROLOGIC: Cranial nerves 2-12 grossly intact. Moves all extremities   Labs reviewed: Basic Metabolic Panel: Recent Labs    12/22/17  NA 136*  K 4.2  BUN 28*  CREATININE 1.1   No results found for: [facility] Liver Function Tests: Recent Labs    12/19/17 12/22/17  AST  --  12*  ALT  --  8  ALKPHOS 77  --    No results for input(s): LIPASE, AMYLASE in the last 8760 hours. No results for input(s): AMMONIA in the last 8760 hours. CBC: Recent Labs    12/22/17  WBC 6.7  HGB 9.3*  HCT 28*  PLT 434*   Lipid No results for input(s): CHOL, HDL, LDLCALC, TRIG in the last 8760 hours. Cardiac Enzymes: No results for input(s): CKTOTAL, CKMB, CKMBINDEX, TROPONINI in the last 8760 hours. BNP: No results for input(s): BNP in the last 8760 hours. CBG: No results for input(s): GLUCAP in the last 8760 hours.  Procedures and Imaging Studies During Stay: No results found.  Assessment/Plan:   Partial thickness burn of right foot, subsequent encounter  Cellulitis of foot  Blindness  Hyponatremia  MSSA (methicillin susceptible Staphylococcus aureus) infection  Acute kidney injury (HCC)  CKD (chronic kidney disease) stage 3, GFR 30-59 ml/min (HCC)  Moderate protein-calorie malnutrition (HCC)  Diabetes mellitus without complication  (HCC)  Renovascular hypertension   Patient is being discharged with the following home health services:  OT/PT/nursing/social worker/Aide  Patient is being discharged with the following durable medical equipment:  None  Patient has been advised to f/u with their PCP in 1-2 weeks to bring them up to date on their rehab stay.  Social services at facility was responsible for arranging this appointment.  Pt was provided with a 30 day supply of prescriptions for medications and refills must be obtained from their PCP.  For controlled substances, a more limited supply may be provided adequate until PCP appointment only.  Medications have been reconciled.  Time spent greater than 30 minutes;> 50% of time with patient was spent reviewing records, labs, tests and studies, counseling and developing plan of care  Inocencio Homes, MD

## 2018-01-11 ENCOUNTER — Encounter: Payer: Self-pay | Admitting: Internal Medicine

## 2018-01-11 DIAGNOSIS — N183 Chronic kidney disease, stage 3 unspecified: Secondary | ICD-10-CM | POA: Insufficient documentation

## 2018-01-12 DIAGNOSIS — S91301D Unspecified open wound, right foot, subsequent encounter: Secondary | ICD-10-CM | POA: Diagnosis not present

## 2018-01-13 ENCOUNTER — Other Ambulatory Visit: Payer: Self-pay | Admitting: Internal Medicine

## 2018-01-14 ENCOUNTER — Other Ambulatory Visit: Payer: Self-pay

## 2018-01-14 DIAGNOSIS — N183 Chronic kidney disease, stage 3 (moderate): Secondary | ICD-10-CM | POA: Diagnosis not present

## 2018-01-14 DIAGNOSIS — H548 Legal blindness, as defined in USA: Secondary | ICD-10-CM | POA: Diagnosis not present

## 2018-01-14 DIAGNOSIS — I150 Renovascular hypertension: Secondary | ICD-10-CM | POA: Diagnosis not present

## 2018-01-14 DIAGNOSIS — T25231D Burn of second degree of right toe(s) (nail), subsequent encounter: Secondary | ICD-10-CM | POA: Diagnosis not present

## 2018-01-14 DIAGNOSIS — I701 Atherosclerosis of renal artery: Secondary | ICD-10-CM | POA: Diagnosis not present

## 2018-01-14 DIAGNOSIS — E1122 Type 2 diabetes mellitus with diabetic chronic kidney disease: Secondary | ICD-10-CM | POA: Diagnosis not present

## 2018-01-14 DIAGNOSIS — H409 Unspecified glaucoma: Secondary | ICD-10-CM | POA: Diagnosis not present

## 2018-01-14 DIAGNOSIS — X12XXXD Contact with other hot fluids, subsequent encounter: Secondary | ICD-10-CM | POA: Diagnosis not present

## 2018-01-14 DIAGNOSIS — M6281 Muscle weakness (generalized): Secondary | ICD-10-CM | POA: Diagnosis not present

## 2018-01-14 NOTE — Patient Outreach (Addendum)
[facility] Care Management  01/14/2018  Madlynn Franciso BendM Anagnos 1956/11/08 [id number]     Transition of Care Referral  Referral Date: 01/14/18 [facility] Date of Admission: unknown Diagnosis: unknown Date of Discharge: 01/13/18 [name]    Outreach attempt # 1 to patient. Spoke with patient and starting talking then patient placed RN CM on hold to answer another call. She returned to call and said another nurse was on the call to speak with her but she couldn't remember company nurse was calling on behalf of. She requested that this RN CM call her back at another time.     Plan: RN CM will make outreach attempt to patient within one business day.   Antionette Fairyoshanda Collins Dimaria, RN,BSN,CCM [facility] Care [facility] Telephonic Care Management Coordinator Direct Phone: [phone] Toll Free: [phone] Fax: [phone]

## 2018-01-15 ENCOUNTER — Other Ambulatory Visit: Payer: Self-pay

## 2018-01-15 DIAGNOSIS — M6281 Muscle weakness (generalized): Secondary | ICD-10-CM | POA: Diagnosis not present

## 2018-01-15 DIAGNOSIS — H548 Legal blindness, as defined in USA: Secondary | ICD-10-CM | POA: Diagnosis not present

## 2018-01-15 DIAGNOSIS — E1122 Type 2 diabetes mellitus with diabetic chronic kidney disease: Secondary | ICD-10-CM | POA: Diagnosis not present

## 2018-01-15 DIAGNOSIS — N183 Chronic kidney disease, stage 3 (moderate): Secondary | ICD-10-CM | POA: Diagnosis not present

## 2018-01-15 DIAGNOSIS — X12XXXD Contact with other hot fluids, subsequent encounter: Secondary | ICD-10-CM | POA: Diagnosis not present

## 2018-01-15 DIAGNOSIS — I150 Renovascular hypertension: Secondary | ICD-10-CM | POA: Diagnosis not present

## 2018-01-15 DIAGNOSIS — I701 Atherosclerosis of renal artery: Secondary | ICD-10-CM | POA: Diagnosis not present

## 2018-01-15 DIAGNOSIS — H409 Unspecified glaucoma: Secondary | ICD-10-CM | POA: Diagnosis not present

## 2018-01-15 DIAGNOSIS — T25231D Burn of second degree of right toe(s) (nail), subsequent encounter: Secondary | ICD-10-CM | POA: Diagnosis not present

## 2018-01-15 NOTE — Patient Outreach (Signed)
[name]) Care Management  01/15/2018  Andrea Oconnell 1956-01-26 [id number]   Transition of Care Referral  Referral Date: 01/14/18 [facility] Date of Admission: unknown Diagnosis: unknown Date of Discharge: 01/13/18 [name]   Outreach attempt #2 to patient.Spoke with patient. She reports that she is doing fairly well since return home. She voices that she has all her meds except her diabetic test strips. She is aware that she needs to contact PCP to get refill on script and encouraged to do so sooner than later. Patient reports that her PCP is Dr. Julio Sickssei-Bonsu. However, she admits that it has been a year or longer since she has seen him. She has MD appt on 01/19/18 to reestablish care with him. She voices she is getting transportation arranged to get her to appt. She reports that she has HHRN coming out and RN was out yesterday to visit with her. Patient feels like she is getting the support and has everything she needs. She [facility] not [facility] like [facility]. She is agreeable to RN CM [facility] for future reference.      Plan: RN [facility] will [facility] of case status. RN CM will request PCP info be updated in system. RN CM will [facility] via mail.   Antionette Fairyoshanda Kahil Agner, RN,BSN,CCM [facility] Care Management Telephonic Care Management Coordinator Direct Phone: [phone] Toll Free: [phone] Fax: [phone]

## 2018-01-16 DIAGNOSIS — N183 Chronic kidney disease, stage 3 (moderate): Secondary | ICD-10-CM | POA: Diagnosis not present

## 2018-01-16 DIAGNOSIS — I150 Renovascular hypertension: Secondary | ICD-10-CM | POA: Diagnosis not present

## 2018-01-16 DIAGNOSIS — H409 Unspecified glaucoma: Secondary | ICD-10-CM | POA: Diagnosis not present

## 2018-01-16 DIAGNOSIS — H548 Legal blindness, as defined in USA: Secondary | ICD-10-CM | POA: Diagnosis not present

## 2018-01-16 DIAGNOSIS — I701 Atherosclerosis of renal artery: Secondary | ICD-10-CM | POA: Diagnosis not present

## 2018-01-16 DIAGNOSIS — M6281 Muscle weakness (generalized): Secondary | ICD-10-CM | POA: Diagnosis not present

## 2018-01-16 DIAGNOSIS — X12XXXD Contact with other hot fluids, subsequent encounter: Secondary | ICD-10-CM | POA: Diagnosis not present

## 2018-01-16 DIAGNOSIS — E1122 Type 2 diabetes mellitus with diabetic chronic kidney disease: Secondary | ICD-10-CM | POA: Diagnosis not present

## 2018-01-16 DIAGNOSIS — T25231D Burn of second degree of right toe(s) (nail), subsequent encounter: Secondary | ICD-10-CM | POA: Diagnosis not present

## 2018-01-19 DIAGNOSIS — I10 Essential (primary) hypertension: Secondary | ICD-10-CM | POA: Diagnosis not present

## 2018-01-19 DIAGNOSIS — E785 Hyperlipidemia, unspecified: Secondary | ICD-10-CM | POA: Diagnosis not present

## 2018-01-19 DIAGNOSIS — N183 Chronic kidney disease, stage 3 (moderate): Secondary | ICD-10-CM | POA: Diagnosis not present

## 2018-01-19 DIAGNOSIS — Z0000 Encounter for general adult medical examination without abnormal findings: Secondary | ICD-10-CM | POA: Diagnosis not present

## 2018-01-19 DIAGNOSIS — E1165 Type 2 diabetes mellitus with hyperglycemia: Secondary | ICD-10-CM | POA: Diagnosis not present

## 2018-01-20 DIAGNOSIS — I150 Renovascular hypertension: Secondary | ICD-10-CM | POA: Diagnosis not present

## 2018-01-20 DIAGNOSIS — X12XXXD Contact with other hot fluids, subsequent encounter: Secondary | ICD-10-CM | POA: Diagnosis not present

## 2018-01-20 DIAGNOSIS — N183 Chronic kidney disease, stage 3 (moderate): Secondary | ICD-10-CM | POA: Diagnosis not present

## 2018-01-20 DIAGNOSIS — E1122 Type 2 diabetes mellitus with diabetic chronic kidney disease: Secondary | ICD-10-CM | POA: Diagnosis not present

## 2018-01-20 DIAGNOSIS — M6281 Muscle weakness (generalized): Secondary | ICD-10-CM | POA: Diagnosis not present

## 2018-01-20 DIAGNOSIS — T25231D Burn of second degree of right toe(s) (nail), subsequent encounter: Secondary | ICD-10-CM | POA: Diagnosis not present

## 2018-01-20 DIAGNOSIS — H409 Unspecified glaucoma: Secondary | ICD-10-CM | POA: Diagnosis not present

## 2018-01-20 DIAGNOSIS — I701 Atherosclerosis of renal artery: Secondary | ICD-10-CM | POA: Diagnosis not present

## 2018-01-20 DIAGNOSIS — H548 Legal blindness, as defined in USA: Secondary | ICD-10-CM | POA: Diagnosis not present

## 2018-01-21 DIAGNOSIS — T25231D Burn of second degree of right toe(s) (nail), subsequent encounter: Secondary | ICD-10-CM | POA: Diagnosis not present

## 2018-01-21 DIAGNOSIS — M6281 Muscle weakness (generalized): Secondary | ICD-10-CM | POA: Diagnosis not present

## 2018-01-21 DIAGNOSIS — H548 Legal blindness, as defined in USA: Secondary | ICD-10-CM | POA: Diagnosis not present

## 2018-01-21 DIAGNOSIS — H409 Unspecified glaucoma: Secondary | ICD-10-CM | POA: Diagnosis not present

## 2018-01-21 DIAGNOSIS — X12XXXD Contact with other hot fluids, subsequent encounter: Secondary | ICD-10-CM | POA: Diagnosis not present

## 2018-01-21 DIAGNOSIS — I150 Renovascular hypertension: Secondary | ICD-10-CM | POA: Diagnosis not present

## 2018-01-21 DIAGNOSIS — E1122 Type 2 diabetes mellitus with diabetic chronic kidney disease: Secondary | ICD-10-CM | POA: Diagnosis not present

## 2018-01-21 DIAGNOSIS — N183 Chronic kidney disease, stage 3 (moderate): Secondary | ICD-10-CM | POA: Diagnosis not present

## 2018-01-21 DIAGNOSIS — I701 Atherosclerosis of renal artery: Secondary | ICD-10-CM | POA: Diagnosis not present

## 2018-01-22 DIAGNOSIS — E1122 Type 2 diabetes mellitus with diabetic chronic kidney disease: Secondary | ICD-10-CM | POA: Diagnosis not present

## 2018-01-22 DIAGNOSIS — I701 Atherosclerosis of renal artery: Secondary | ICD-10-CM | POA: Diagnosis not present

## 2018-01-22 DIAGNOSIS — M6281 Muscle weakness (generalized): Secondary | ICD-10-CM | POA: Diagnosis not present

## 2018-01-22 DIAGNOSIS — I150 Renovascular hypertension: Secondary | ICD-10-CM | POA: Diagnosis not present

## 2018-01-22 DIAGNOSIS — T25231D Burn of second degree of right toe(s) (nail), subsequent encounter: Secondary | ICD-10-CM | POA: Diagnosis not present

## 2018-01-22 DIAGNOSIS — N183 Chronic kidney disease, stage 3 (moderate): Secondary | ICD-10-CM | POA: Diagnosis not present

## 2018-01-22 DIAGNOSIS — H409 Unspecified glaucoma: Secondary | ICD-10-CM | POA: Diagnosis not present

## 2018-01-22 DIAGNOSIS — H548 Legal blindness, as defined in USA: Secondary | ICD-10-CM | POA: Diagnosis not present

## 2018-01-22 DIAGNOSIS — X12XXXD Contact with other hot fluids, subsequent encounter: Secondary | ICD-10-CM | POA: Diagnosis not present

## 2018-01-27 DIAGNOSIS — T25231D Burn of second degree of right toe(s) (nail), subsequent encounter: Secondary | ICD-10-CM | POA: Diagnosis not present

## 2018-01-27 DIAGNOSIS — H409 Unspecified glaucoma: Secondary | ICD-10-CM | POA: Diagnosis not present

## 2018-01-27 DIAGNOSIS — M6281 Muscle weakness (generalized): Secondary | ICD-10-CM | POA: Diagnosis not present

## 2018-01-27 DIAGNOSIS — H548 Legal blindness, as defined in USA: Secondary | ICD-10-CM | POA: Diagnosis not present

## 2018-01-27 DIAGNOSIS — X12XXXD Contact with other hot fluids, subsequent encounter: Secondary | ICD-10-CM | POA: Diagnosis not present

## 2018-01-27 DIAGNOSIS — N183 Chronic kidney disease, stage 3 (moderate): Secondary | ICD-10-CM | POA: Diagnosis not present

## 2018-01-27 DIAGNOSIS — E1122 Type 2 diabetes mellitus with diabetic chronic kidney disease: Secondary | ICD-10-CM | POA: Diagnosis not present

## 2018-01-27 DIAGNOSIS — I150 Renovascular hypertension: Secondary | ICD-10-CM | POA: Diagnosis not present

## 2018-01-27 DIAGNOSIS — I701 Atherosclerosis of renal artery: Secondary | ICD-10-CM | POA: Diagnosis not present

## 2018-01-28 DIAGNOSIS — X12XXXD Contact with other hot fluids, subsequent encounter: Secondary | ICD-10-CM | POA: Diagnosis not present

## 2018-01-28 DIAGNOSIS — E1122 Type 2 diabetes mellitus with diabetic chronic kidney disease: Secondary | ICD-10-CM | POA: Diagnosis not present

## 2018-01-28 DIAGNOSIS — H548 Legal blindness, as defined in USA: Secondary | ICD-10-CM | POA: Diagnosis not present

## 2018-01-28 DIAGNOSIS — I150 Renovascular hypertension: Secondary | ICD-10-CM | POA: Diagnosis not present

## 2018-01-28 DIAGNOSIS — T25231D Burn of second degree of right toe(s) (nail), subsequent encounter: Secondary | ICD-10-CM | POA: Diagnosis not present

## 2018-01-28 DIAGNOSIS — M6281 Muscle weakness (generalized): Secondary | ICD-10-CM | POA: Diagnosis not present

## 2018-01-28 DIAGNOSIS — I701 Atherosclerosis of renal artery: Secondary | ICD-10-CM | POA: Diagnosis not present

## 2018-01-28 DIAGNOSIS — N183 Chronic kidney disease, stage 3 (moderate): Secondary | ICD-10-CM | POA: Diagnosis not present

## 2018-01-28 DIAGNOSIS — H409 Unspecified glaucoma: Secondary | ICD-10-CM | POA: Diagnosis not present

## 2018-01-29 DIAGNOSIS — N183 Chronic kidney disease, stage 3 (moderate): Secondary | ICD-10-CM | POA: Diagnosis not present

## 2018-01-29 DIAGNOSIS — H548 Legal blindness, as defined in USA: Secondary | ICD-10-CM | POA: Diagnosis not present

## 2018-01-29 DIAGNOSIS — I150 Renovascular hypertension: Secondary | ICD-10-CM | POA: Diagnosis not present

## 2018-01-29 DIAGNOSIS — E1122 Type 2 diabetes mellitus with diabetic chronic kidney disease: Secondary | ICD-10-CM | POA: Diagnosis not present

## 2018-01-29 DIAGNOSIS — I701 Atherosclerosis of renal artery: Secondary | ICD-10-CM | POA: Diagnosis not present

## 2018-01-29 DIAGNOSIS — M6281 Muscle weakness (generalized): Secondary | ICD-10-CM | POA: Diagnosis not present

## 2018-01-29 DIAGNOSIS — T25231D Burn of second degree of right toe(s) (nail), subsequent encounter: Secondary | ICD-10-CM | POA: Diagnosis not present

## 2018-01-29 DIAGNOSIS — X12XXXD Contact with other hot fluids, subsequent encounter: Secondary | ICD-10-CM | POA: Diagnosis not present

## 2018-01-29 DIAGNOSIS — H409 Unspecified glaucoma: Secondary | ICD-10-CM | POA: Diagnosis not present

## 2018-02-02 ENCOUNTER — Other Ambulatory Visit: Payer: Self-pay | Admitting: Internal Medicine

## 2018-02-02 DIAGNOSIS — E785 Hyperlipidemia, unspecified: Secondary | ICD-10-CM | POA: Diagnosis not present

## 2018-02-02 DIAGNOSIS — E1165 Type 2 diabetes mellitus with hyperglycemia: Secondary | ICD-10-CM | POA: Diagnosis not present

## 2018-02-02 DIAGNOSIS — N183 Chronic kidney disease, stage 3 (moderate): Secondary | ICD-10-CM | POA: Diagnosis not present

## 2018-02-02 DIAGNOSIS — I10 Essential (primary) hypertension: Secondary | ICD-10-CM | POA: Diagnosis not present

## 2018-02-02 NOTE — Telephone Encounter (Signed)
I believe she is your patient. [employer]

## 2018-02-03 DIAGNOSIS — E1122 Type 2 diabetes mellitus with diabetic chronic kidney disease: Secondary | ICD-10-CM | POA: Diagnosis not present

## 2018-02-03 DIAGNOSIS — N183 Chronic kidney disease, stage 3 (moderate): Secondary | ICD-10-CM | POA: Diagnosis not present

## 2018-02-03 DIAGNOSIS — M6281 Muscle weakness (generalized): Secondary | ICD-10-CM | POA: Diagnosis not present

## 2018-02-03 DIAGNOSIS — H409 Unspecified glaucoma: Secondary | ICD-10-CM | POA: Diagnosis not present

## 2018-02-03 DIAGNOSIS — I701 Atherosclerosis of renal artery: Secondary | ICD-10-CM | POA: Diagnosis not present

## 2018-02-03 DIAGNOSIS — I150 Renovascular hypertension: Secondary | ICD-10-CM | POA: Diagnosis not present

## 2018-02-03 DIAGNOSIS — H548 Legal blindness, as defined in USA: Secondary | ICD-10-CM | POA: Diagnosis not present

## 2018-02-03 DIAGNOSIS — T25231D Burn of second degree of right toe(s) (nail), subsequent encounter: Secondary | ICD-10-CM | POA: Diagnosis not present

## 2018-02-03 DIAGNOSIS — X12XXXD Contact with other hot fluids, subsequent encounter: Secondary | ICD-10-CM | POA: Diagnosis not present

## 2018-02-10 DIAGNOSIS — N183 Chronic kidney disease, stage 3 (moderate): Secondary | ICD-10-CM | POA: Diagnosis not present

## 2018-02-10 DIAGNOSIS — H548 Legal blindness, as defined in USA: Secondary | ICD-10-CM | POA: Diagnosis not present

## 2018-02-10 DIAGNOSIS — I701 Atherosclerosis of renal artery: Secondary | ICD-10-CM | POA: Diagnosis not present

## 2018-02-10 DIAGNOSIS — I150 Renovascular hypertension: Secondary | ICD-10-CM | POA: Diagnosis not present

## 2018-02-10 DIAGNOSIS — E1122 Type 2 diabetes mellitus with diabetic chronic kidney disease: Secondary | ICD-10-CM | POA: Diagnosis not present

## 2018-02-10 DIAGNOSIS — T25231D Burn of second degree of right toe(s) (nail), subsequent encounter: Secondary | ICD-10-CM | POA: Diagnosis not present

## 2018-02-10 DIAGNOSIS — M6281 Muscle weakness (generalized): Secondary | ICD-10-CM | POA: Diagnosis not present

## 2018-02-10 DIAGNOSIS — X12XXXD Contact with other hot fluids, subsequent encounter: Secondary | ICD-10-CM | POA: Diagnosis not present

## 2018-02-10 DIAGNOSIS — H409 Unspecified glaucoma: Secondary | ICD-10-CM | POA: Diagnosis not present

## 2018-02-12 DIAGNOSIS — H409 Unspecified glaucoma: Secondary | ICD-10-CM | POA: Diagnosis not present

## 2018-02-12 DIAGNOSIS — M6281 Muscle weakness (generalized): Secondary | ICD-10-CM | POA: Diagnosis not present

## 2018-02-12 DIAGNOSIS — E1122 Type 2 diabetes mellitus with diabetic chronic kidney disease: Secondary | ICD-10-CM | POA: Diagnosis not present

## 2018-02-12 DIAGNOSIS — I701 Atherosclerosis of renal artery: Secondary | ICD-10-CM | POA: Diagnosis not present

## 2018-02-12 DIAGNOSIS — I150 Renovascular hypertension: Secondary | ICD-10-CM | POA: Diagnosis not present

## 2018-02-12 DIAGNOSIS — T25231D Burn of second degree of right toe(s) (nail), subsequent encounter: Secondary | ICD-10-CM | POA: Diagnosis not present

## 2018-02-12 DIAGNOSIS — H548 Legal blindness, as defined in USA: Secondary | ICD-10-CM | POA: Diagnosis not present

## 2018-02-12 DIAGNOSIS — X12XXXD Contact with other hot fluids, subsequent encounter: Secondary | ICD-10-CM | POA: Diagnosis not present

## 2018-02-12 DIAGNOSIS — N183 Chronic kidney disease, stage 3 (moderate): Secondary | ICD-10-CM | POA: Diagnosis not present

## 2018-02-17 DIAGNOSIS — H548 Legal blindness, as defined in USA: Secondary | ICD-10-CM | POA: Diagnosis not present

## 2018-02-17 DIAGNOSIS — I701 Atherosclerosis of renal artery: Secondary | ICD-10-CM | POA: Diagnosis not present

## 2018-02-17 DIAGNOSIS — H409 Unspecified glaucoma: Secondary | ICD-10-CM | POA: Diagnosis not present

## 2018-02-17 DIAGNOSIS — T25231D Burn of second degree of right toe(s) (nail), subsequent encounter: Secondary | ICD-10-CM | POA: Diagnosis not present

## 2018-02-17 DIAGNOSIS — I150 Renovascular hypertension: Secondary | ICD-10-CM | POA: Diagnosis not present

## 2018-02-17 DIAGNOSIS — N183 Chronic kidney disease, stage 3 (moderate): Secondary | ICD-10-CM | POA: Diagnosis not present

## 2018-02-17 DIAGNOSIS — M6281 Muscle weakness (generalized): Secondary | ICD-10-CM | POA: Diagnosis not present

## 2018-02-17 DIAGNOSIS — X12XXXD Contact with other hot fluids, subsequent encounter: Secondary | ICD-10-CM | POA: Diagnosis not present

## 2018-02-17 DIAGNOSIS — E1122 Type 2 diabetes mellitus with diabetic chronic kidney disease: Secondary | ICD-10-CM | POA: Diagnosis not present

## 2018-02-18 DIAGNOSIS — I10 Essential (primary) hypertension: Secondary | ICD-10-CM | POA: Diagnosis not present

## 2018-02-18 DIAGNOSIS — E1165 Type 2 diabetes mellitus with hyperglycemia: Secondary | ICD-10-CM | POA: Diagnosis not present

## 2018-02-24 DIAGNOSIS — I701 Atherosclerosis of renal artery: Secondary | ICD-10-CM | POA: Diagnosis not present

## 2018-02-24 DIAGNOSIS — M6281 Muscle weakness (generalized): Secondary | ICD-10-CM | POA: Diagnosis not present

## 2018-02-24 DIAGNOSIS — H548 Legal blindness, as defined in USA: Secondary | ICD-10-CM | POA: Diagnosis not present

## 2018-02-24 DIAGNOSIS — E1122 Type 2 diabetes mellitus with diabetic chronic kidney disease: Secondary | ICD-10-CM | POA: Diagnosis not present

## 2018-02-24 DIAGNOSIS — N183 Chronic kidney disease, stage 3 (moderate): Secondary | ICD-10-CM | POA: Diagnosis not present

## 2018-02-24 DIAGNOSIS — I150 Renovascular hypertension: Secondary | ICD-10-CM | POA: Diagnosis not present

## 2018-02-24 DIAGNOSIS — H409 Unspecified glaucoma: Secondary | ICD-10-CM | POA: Diagnosis not present

## 2018-02-24 DIAGNOSIS — T25231D Burn of second degree of right toe(s) (nail), subsequent encounter: Secondary | ICD-10-CM | POA: Diagnosis not present

## 2018-02-24 DIAGNOSIS — X12XXXD Contact with other hot fluids, subsequent encounter: Secondary | ICD-10-CM | POA: Diagnosis not present

## 2018-03-03 DIAGNOSIS — H548 Legal blindness, as defined in USA: Secondary | ICD-10-CM | POA: Diagnosis not present

## 2018-03-03 DIAGNOSIS — X12XXXD Contact with other hot fluids, subsequent encounter: Secondary | ICD-10-CM | POA: Diagnosis not present

## 2018-03-03 DIAGNOSIS — M6281 Muscle weakness (generalized): Secondary | ICD-10-CM | POA: Diagnosis not present

## 2018-03-03 DIAGNOSIS — T25231D Burn of second degree of right toe(s) (nail), subsequent encounter: Secondary | ICD-10-CM | POA: Diagnosis not present

## 2018-03-03 DIAGNOSIS — H409 Unspecified glaucoma: Secondary | ICD-10-CM | POA: Diagnosis not present

## 2018-03-03 DIAGNOSIS — E1122 Type 2 diabetes mellitus with diabetic chronic kidney disease: Secondary | ICD-10-CM | POA: Diagnosis not present

## 2018-03-03 DIAGNOSIS — N183 Chronic kidney disease, stage 3 (moderate): Secondary | ICD-10-CM | POA: Diagnosis not present

## 2018-03-03 DIAGNOSIS — I701 Atherosclerosis of renal artery: Secondary | ICD-10-CM | POA: Diagnosis not present

## 2018-03-03 DIAGNOSIS — I150 Renovascular hypertension: Secondary | ICD-10-CM | POA: Diagnosis not present

## 2018-03-17 DIAGNOSIS — Z0000 Encounter for general adult medical examination without abnormal findings: Secondary | ICD-10-CM | POA: Diagnosis not present

## 2018-03-17 DIAGNOSIS — E785 Hyperlipidemia, unspecified: Secondary | ICD-10-CM | POA: Diagnosis not present

## 2018-03-17 DIAGNOSIS — N183 Chronic kidney disease, stage 3 (moderate): Secondary | ICD-10-CM | POA: Diagnosis not present

## 2018-03-17 DIAGNOSIS — E1165 Type 2 diabetes mellitus with hyperglycemia: Secondary | ICD-10-CM | POA: Diagnosis not present

## 2018-03-17 DIAGNOSIS — I10 Essential (primary) hypertension: Secondary | ICD-10-CM | POA: Diagnosis not present

## 2018-03-17 NOTE — Telephone Encounter (Signed)
[facility] and is no longer my patient.

## 2018-03-18 DIAGNOSIS — E785 Hyperlipidemia, unspecified: Secondary | ICD-10-CM | POA: Diagnosis not present

## 2018-03-18 DIAGNOSIS — I10 Essential (primary) hypertension: Secondary | ICD-10-CM | POA: Diagnosis not present

## 2018-04-22 DIAGNOSIS — E1165 Type 2 diabetes mellitus with hyperglycemia: Secondary | ICD-10-CM | POA: Diagnosis not present

## 2018-04-22 DIAGNOSIS — I10 Essential (primary) hypertension: Secondary | ICD-10-CM | POA: Diagnosis not present

## 2019-10-19 DIAGNOSIS — I10 Essential (primary) hypertension: Secondary | ICD-10-CM | POA: Diagnosis not present

## 2019-10-19 DIAGNOSIS — E1165 Type 2 diabetes mellitus with hyperglycemia: Secondary | ICD-10-CM | POA: Diagnosis not present

## 2019-10-19 DIAGNOSIS — E782 Mixed hyperlipidemia: Secondary | ICD-10-CM | POA: Diagnosis not present

## 2019-10-19 DIAGNOSIS — N1832 Chronic kidney disease, stage 3b: Secondary | ICD-10-CM | POA: Diagnosis not present

## 2019-10-26 DIAGNOSIS — Z0110 Encounter for examination of ears and hearing without abnormal findings: Secondary | ICD-10-CM | POA: Diagnosis not present

## 2019-10-26 DIAGNOSIS — Z0000 Encounter for general adult medical examination without abnormal findings: Secondary | ICD-10-CM | POA: Diagnosis not present

## 2019-10-26 DIAGNOSIS — Z23 Encounter for immunization: Secondary | ICD-10-CM | POA: Diagnosis not present

## 2019-10-26 DIAGNOSIS — Z1231 Encounter for screening mammogram for malignant neoplasm of breast: Secondary | ICD-10-CM | POA: Diagnosis not present

## 2019-10-26 DIAGNOSIS — I10 Essential (primary) hypertension: Secondary | ICD-10-CM | POA: Diagnosis not present

## 2019-10-26 DIAGNOSIS — E1165 Type 2 diabetes mellitus with hyperglycemia: Secondary | ICD-10-CM | POA: Diagnosis not present

## 2019-10-26 DIAGNOSIS — E782 Mixed hyperlipidemia: Secondary | ICD-10-CM | POA: Diagnosis not present

## 2019-10-26 DIAGNOSIS — Z1211 Encounter for screening for malignant neoplasm of colon: Secondary | ICD-10-CM | POA: Diagnosis not present

## 2019-11-04 DIAGNOSIS — Z1212 Encounter for screening for malignant neoplasm of rectum: Secondary | ICD-10-CM | POA: Diagnosis not present

## 2019-11-04 DIAGNOSIS — Z1211 Encounter for screening for malignant neoplasm of colon: Secondary | ICD-10-CM | POA: Diagnosis not present

## 2020-09-30 DIAGNOSIS — M47812 Spondylosis without myelopathy or radiculopathy, cervical region: Secondary | ICD-10-CM | POA: Diagnosis not present

## 2020-09-30 DIAGNOSIS — E559 Vitamin D deficiency, unspecified: Secondary | ICD-10-CM | POA: Diagnosis not present

## 2020-09-30 DIAGNOSIS — F32A Depression, unspecified: Secondary | ICD-10-CM | POA: Diagnosis not present

## 2020-09-30 DIAGNOSIS — I129 Hypertensive chronic kidney disease with stage 1 through stage 4 chronic kidney disease, or unspecified chronic kidney disease: Secondary | ICD-10-CM | POA: Diagnosis not present

## 2020-09-30 DIAGNOSIS — E1122 Type 2 diabetes mellitus with diabetic chronic kidney disease: Secondary | ICD-10-CM | POA: Diagnosis not present

## 2020-09-30 DIAGNOSIS — J3489 Other specified disorders of nose and nasal sinuses: Secondary | ICD-10-CM | POA: Diagnosis not present

## 2020-09-30 DIAGNOSIS — I6782 Cerebral ischemia: Secondary | ICD-10-CM | POA: Diagnosis not present

## 2020-09-30 DIAGNOSIS — N3000 Acute cystitis without hematuria: Secondary | ICD-10-CM | POA: Diagnosis not present

## 2020-09-30 DIAGNOSIS — J323 Chronic sphenoidal sinusitis: Secondary | ICD-10-CM | POA: Diagnosis not present

## 2020-09-30 DIAGNOSIS — H548 Legal blindness, as defined in USA: Secondary | ICD-10-CM | POA: Diagnosis not present

## 2020-09-30 DIAGNOSIS — E1165 Type 2 diabetes mellitus with hyperglycemia: Secondary | ICD-10-CM | POA: Diagnosis not present

## 2020-09-30 DIAGNOSIS — R42 Dizziness and giddiness: Secondary | ICD-10-CM | POA: Diagnosis not present

## 2020-09-30 DIAGNOSIS — E785 Hyperlipidemia, unspecified: Secondary | ICD-10-CM | POA: Diagnosis not present

## 2020-09-30 DIAGNOSIS — R220 Localized swelling, mass and lump, head: Secondary | ICD-10-CM | POA: Diagnosis not present

## 2020-09-30 DIAGNOSIS — R4689 Other symptoms and signs involving appearance and behavior: Secondary | ICD-10-CM | POA: Diagnosis not present

## 2020-09-30 DIAGNOSIS — Z23 Encounter for immunization: Secondary | ICD-10-CM | POA: Diagnosis not present

## 2020-09-30 DIAGNOSIS — N183 Chronic kidney disease, stage 3 unspecified: Secondary | ICD-10-CM | POA: Diagnosis not present

## 2020-09-30 DIAGNOSIS — N3090 Cystitis, unspecified without hematuria: Secondary | ICD-10-CM | POA: Diagnosis not present

## 2020-09-30 DIAGNOSIS — Z7409 Other reduced mobility: Secondary | ICD-10-CM | POA: Diagnosis not present

## 2020-09-30 DIAGNOSIS — I10 Essential (primary) hypertension: Secondary | ICD-10-CM | POA: Diagnosis not present

## 2020-09-30 DIAGNOSIS — W19XXXA Unspecified fall, initial encounter: Secondary | ICD-10-CM | POA: Diagnosis not present

## 2020-09-30 DIAGNOSIS — E440 Moderate protein-calorie malnutrition: Secondary | ICD-10-CM | POA: Diagnosis not present

## 2020-10-01 DIAGNOSIS — I440 Atrioventricular block, first degree: Secondary | ICD-10-CM | POA: Diagnosis not present

## 2020-10-01 DIAGNOSIS — R9431 Abnormal electrocardiogram [ECG] [EKG]: Secondary | ICD-10-CM | POA: Diagnosis not present

## 2020-10-01 DIAGNOSIS — N390 Urinary tract infection, site not specified: Secondary | ICD-10-CM | POA: Diagnosis not present

## 2020-10-01 DIAGNOSIS — W19XXXA Unspecified fall, initial encounter: Secondary | ICD-10-CM | POA: Diagnosis not present

## 2020-10-01 DIAGNOSIS — R000 Tachycardia, unspecified: Secondary | ICD-10-CM | POA: Diagnosis not present

## 2020-10-01 DIAGNOSIS — I10 Essential (primary) hypertension: Secondary | ICD-10-CM | POA: Diagnosis not present

## 2020-10-02 DIAGNOSIS — W19XXXA Unspecified fall, initial encounter: Secondary | ICD-10-CM | POA: Diagnosis not present

## 2020-10-02 DIAGNOSIS — R5381 Other malaise: Secondary | ICD-10-CM | POA: Diagnosis not present

## 2020-10-02 DIAGNOSIS — M2550 Pain in unspecified joint: Secondary | ICD-10-CM | POA: Diagnosis not present

## 2020-10-02 DIAGNOSIS — G629 Polyneuropathy, unspecified: Secondary | ICD-10-CM | POA: Diagnosis not present

## 2020-10-02 DIAGNOSIS — L89620 Pressure ulcer of left heel, unstageable: Secondary | ICD-10-CM | POA: Diagnosis not present

## 2020-10-02 DIAGNOSIS — N3000 Acute cystitis without hematuria: Secondary | ICD-10-CM | POA: Diagnosis not present

## 2020-10-02 DIAGNOSIS — I700 Atherosclerosis of aorta: Secondary | ICD-10-CM | POA: Diagnosis not present

## 2020-10-02 DIAGNOSIS — Z7401 Bed confinement status: Secondary | ICD-10-CM | POA: Diagnosis not present

## 2020-10-02 DIAGNOSIS — L89610 Pressure ulcer of right heel, unstageable: Secondary | ICD-10-CM | POA: Diagnosis not present

## 2020-10-02 DIAGNOSIS — D649 Anemia, unspecified: Secondary | ICD-10-CM | POA: Diagnosis not present

## 2020-10-02 DIAGNOSIS — H4051X3 Glaucoma secondary to other eye disorders, right eye, severe stage: Secondary | ICD-10-CM | POA: Diagnosis not present

## 2020-10-02 DIAGNOSIS — I10 Essential (primary) hypertension: Secondary | ICD-10-CM | POA: Diagnosis not present

## 2020-10-02 DIAGNOSIS — E8809 Other disorders of plasma-protein metabolism, not elsewhere classified: Secondary | ICD-10-CM | POA: Diagnosis not present

## 2020-10-02 DIAGNOSIS — Z23 Encounter for immunization: Secondary | ICD-10-CM | POA: Diagnosis not present

## 2020-10-02 DIAGNOSIS — E785 Hyperlipidemia, unspecified: Secondary | ICD-10-CM | POA: Diagnosis not present

## 2020-10-02 DIAGNOSIS — U071 COVID-19: Secondary | ICD-10-CM | POA: Diagnosis not present

## 2020-10-02 DIAGNOSIS — I517 Cardiomegaly: Secondary | ICD-10-CM | POA: Diagnosis not present

## 2020-10-02 DIAGNOSIS — F32A Depression, unspecified: Secondary | ICD-10-CM | POA: Diagnosis not present

## 2020-10-02 DIAGNOSIS — N390 Urinary tract infection, site not specified: Secondary | ICD-10-CM | POA: Diagnosis not present

## 2020-10-02 DIAGNOSIS — I129 Hypertensive chronic kidney disease with stage 1 through stage 4 chronic kidney disease, or unspecified chronic kidney disease: Secondary | ICD-10-CM | POA: Diagnosis not present

## 2020-10-02 DIAGNOSIS — E1165 Type 2 diabetes mellitus with hyperglycemia: Secondary | ICD-10-CM | POA: Diagnosis not present

## 2020-10-02 DIAGNOSIS — H4052X3 Glaucoma secondary to other eye disorders, left eye, severe stage: Secondary | ICD-10-CM | POA: Diagnosis not present

## 2020-10-02 DIAGNOSIS — I340 Nonrheumatic mitral (valve) insufficiency: Secondary | ICD-10-CM | POA: Diagnosis not present

## 2020-10-02 DIAGNOSIS — W19XXXD Unspecified fall, subsequent encounter: Secondary | ICD-10-CM | POA: Diagnosis not present

## 2020-10-02 DIAGNOSIS — N1831 Chronic kidney disease, stage 3a: Secondary | ICD-10-CM | POA: Diagnosis not present

## 2020-10-02 DIAGNOSIS — H548 Legal blindness, as defined in USA: Secondary | ICD-10-CM | POA: Diagnosis not present

## 2020-10-02 DIAGNOSIS — E1122 Type 2 diabetes mellitus with diabetic chronic kidney disease: Secondary | ICD-10-CM | POA: Diagnosis not present

## 2020-10-02 DIAGNOSIS — I69815 Cognitive social or emotional deficit following other cerebrovascular disease: Secondary | ICD-10-CM | POA: Diagnosis not present

## 2020-10-02 DIAGNOSIS — R748 Abnormal levels of other serum enzymes: Secondary | ICD-10-CM | POA: Diagnosis not present

## 2020-10-02 DIAGNOSIS — E440 Moderate protein-calorie malnutrition: Secondary | ICD-10-CM | POA: Diagnosis not present

## 2020-10-02 DIAGNOSIS — E559 Vitamin D deficiency, unspecified: Secondary | ICD-10-CM | POA: Diagnosis not present

## 2020-10-02 DIAGNOSIS — I6340 Cerebral infarction due to embolism of unspecified cerebral artery: Secondary | ICD-10-CM | POA: Diagnosis not present

## 2020-10-02 DIAGNOSIS — Y92009 Unspecified place in unspecified non-institutional (private) residence as the place of occurrence of the external cause: Secondary | ICD-10-CM | POA: Diagnosis not present

## 2020-10-02 DIAGNOSIS — H44513 Absolute glaucoma, bilateral: Secondary | ICD-10-CM | POA: Diagnosis not present

## 2020-10-02 DIAGNOSIS — J020 Streptococcal pharyngitis: Secondary | ICD-10-CM | POA: Diagnosis not present

## 2020-10-02 DIAGNOSIS — N183 Chronic kidney disease, stage 3 unspecified: Secondary | ICD-10-CM | POA: Diagnosis not present

## 2020-10-03 DIAGNOSIS — R748 Abnormal levels of other serum enzymes: Secondary | ICD-10-CM | POA: Diagnosis not present

## 2020-10-03 DIAGNOSIS — E785 Hyperlipidemia, unspecified: Secondary | ICD-10-CM | POA: Diagnosis not present

## 2020-10-03 DIAGNOSIS — E1165 Type 2 diabetes mellitus with hyperglycemia: Secondary | ICD-10-CM | POA: Diagnosis not present

## 2020-10-03 DIAGNOSIS — Y92009 Unspecified place in unspecified non-institutional (private) residence as the place of occurrence of the external cause: Secondary | ICD-10-CM | POA: Diagnosis not present

## 2020-10-03 DIAGNOSIS — W19XXXA Unspecified fall, initial encounter: Secondary | ICD-10-CM | POA: Diagnosis not present

## 2020-10-03 DIAGNOSIS — N3000 Acute cystitis without hematuria: Secondary | ICD-10-CM | POA: Diagnosis not present

## 2020-10-03 DIAGNOSIS — I129 Hypertensive chronic kidney disease with stage 1 through stage 4 chronic kidney disease, or unspecified chronic kidney disease: Secondary | ICD-10-CM | POA: Diagnosis not present

## 2020-10-03 DIAGNOSIS — N1831 Chronic kidney disease, stage 3a: Secondary | ICD-10-CM | POA: Diagnosis not present

## 2020-10-04 DIAGNOSIS — U071 COVID-19: Secondary | ICD-10-CM | POA: Diagnosis not present

## 2020-10-09 DIAGNOSIS — I129 Hypertensive chronic kidney disease with stage 1 through stage 4 chronic kidney disease, or unspecified chronic kidney disease: Secondary | ICD-10-CM | POA: Diagnosis not present

## 2020-10-09 DIAGNOSIS — I6340 Cerebral infarction due to embolism of unspecified cerebral artery: Secondary | ICD-10-CM | POA: Diagnosis not present

## 2020-10-10 DIAGNOSIS — L89610 Pressure ulcer of right heel, unstageable: Secondary | ICD-10-CM | POA: Diagnosis not present

## 2020-10-10 DIAGNOSIS — E559 Vitamin D deficiency, unspecified: Secondary | ICD-10-CM | POA: Diagnosis not present

## 2020-10-10 DIAGNOSIS — R748 Abnormal levels of other serum enzymes: Secondary | ICD-10-CM | POA: Diagnosis not present

## 2020-10-10 DIAGNOSIS — D649 Anemia, unspecified: Secondary | ICD-10-CM | POA: Diagnosis not present

## 2020-10-10 DIAGNOSIS — E1165 Type 2 diabetes mellitus with hyperglycemia: Secondary | ICD-10-CM | POA: Diagnosis not present

## 2020-10-10 DIAGNOSIS — N1831 Chronic kidney disease, stage 3a: Secondary | ICD-10-CM | POA: Diagnosis not present

## 2020-10-10 DIAGNOSIS — E8809 Other disorders of plasma-protein metabolism, not elsewhere classified: Secondary | ICD-10-CM | POA: Diagnosis not present

## 2020-10-16 DIAGNOSIS — I129 Hypertensive chronic kidney disease with stage 1 through stage 4 chronic kidney disease, or unspecified chronic kidney disease: Secondary | ICD-10-CM | POA: Diagnosis not present

## 2020-10-16 DIAGNOSIS — I6340 Cerebral infarction due to embolism of unspecified cerebral artery: Secondary | ICD-10-CM | POA: Diagnosis not present

## 2020-10-17 DIAGNOSIS — G629 Polyneuropathy, unspecified: Secondary | ICD-10-CM | POA: Diagnosis not present

## 2020-10-17 DIAGNOSIS — L89610 Pressure ulcer of right heel, unstageable: Secondary | ICD-10-CM | POA: Diagnosis not present

## 2020-10-17 DIAGNOSIS — D649 Anemia, unspecified: Secondary | ICD-10-CM | POA: Diagnosis not present

## 2020-10-17 DIAGNOSIS — N1831 Chronic kidney disease, stage 3a: Secondary | ICD-10-CM | POA: Diagnosis not present

## 2020-10-24 DIAGNOSIS — L89610 Pressure ulcer of right heel, unstageable: Secondary | ICD-10-CM | POA: Diagnosis not present

## 2020-10-25 DIAGNOSIS — E440 Moderate protein-calorie malnutrition: Secondary | ICD-10-CM | POA: Diagnosis not present

## 2020-10-25 DIAGNOSIS — J020 Streptococcal pharyngitis: Secondary | ICD-10-CM | POA: Diagnosis not present

## 2020-10-25 DIAGNOSIS — H4051X3 Glaucoma secondary to other eye disorders, right eye, severe stage: Secondary | ICD-10-CM | POA: Diagnosis not present

## 2020-10-25 DIAGNOSIS — W19XXXD Unspecified fall, subsequent encounter: Secondary | ICD-10-CM | POA: Diagnosis not present

## 2020-10-25 DIAGNOSIS — E1165 Type 2 diabetes mellitus with hyperglycemia: Secondary | ICD-10-CM | POA: Diagnosis not present

## 2020-10-25 DIAGNOSIS — L89620 Pressure ulcer of left heel, unstageable: Secondary | ICD-10-CM | POA: Diagnosis not present

## 2020-10-25 DIAGNOSIS — N183 Chronic kidney disease, stage 3 unspecified: Secondary | ICD-10-CM | POA: Diagnosis not present

## 2020-10-25 DIAGNOSIS — H4052X3 Glaucoma secondary to other eye disorders, left eye, severe stage: Secondary | ICD-10-CM | POA: Diagnosis not present

## 2020-10-25 DIAGNOSIS — G629 Polyneuropathy, unspecified: Secondary | ICD-10-CM | POA: Diagnosis not present

## 2020-10-25 DIAGNOSIS — I69815 Cognitive social or emotional deficit following other cerebrovascular disease: Secondary | ICD-10-CM | POA: Diagnosis not present

## 2020-10-25 DIAGNOSIS — H44513 Absolute glaucoma, bilateral: Secondary | ICD-10-CM | POA: Diagnosis not present

## 2020-10-25 DIAGNOSIS — N3000 Acute cystitis without hematuria: Secondary | ICD-10-CM | POA: Diagnosis not present

## 2020-10-25 DIAGNOSIS — I129 Hypertensive chronic kidney disease with stage 1 through stage 4 chronic kidney disease, or unspecified chronic kidney disease: Secondary | ICD-10-CM | POA: Diagnosis not present

## 2020-10-25 DIAGNOSIS — Y92009 Unspecified place in unspecified non-institutional (private) residence as the place of occurrence of the external cause: Secondary | ICD-10-CM | POA: Diagnosis not present

## 2020-10-27 DIAGNOSIS — W19XXXD Unspecified fall, subsequent encounter: Secondary | ICD-10-CM | POA: Diagnosis not present

## 2020-10-27 DIAGNOSIS — I129 Hypertensive chronic kidney disease with stage 1 through stage 4 chronic kidney disease, or unspecified chronic kidney disease: Secondary | ICD-10-CM | POA: Diagnosis not present

## 2020-10-27 DIAGNOSIS — E1165 Type 2 diabetes mellitus with hyperglycemia: Secondary | ICD-10-CM | POA: Diagnosis not present

## 2020-10-27 DIAGNOSIS — G629 Polyneuropathy, unspecified: Secondary | ICD-10-CM | POA: Diagnosis not present

## 2020-10-27 DIAGNOSIS — Y92009 Unspecified place in unspecified non-institutional (private) residence as the place of occurrence of the external cause: Secondary | ICD-10-CM | POA: Diagnosis not present

## 2020-10-31 DIAGNOSIS — L89620 Pressure ulcer of left heel, unstageable: Secondary | ICD-10-CM | POA: Diagnosis not present

## 2020-10-31 DIAGNOSIS — E1165 Type 2 diabetes mellitus with hyperglycemia: Secondary | ICD-10-CM | POA: Diagnosis not present

## 2020-11-07 ENCOUNTER — Other Ambulatory Visit: Payer: Self-pay

## 2020-11-07 DIAGNOSIS — L89610 Pressure ulcer of right heel, unstageable: Secondary | ICD-10-CM | POA: Diagnosis not present

## 2020-11-07 NOTE — Patient Outreach (Signed)
[facility]) Care Management  11/07/2020  Andrea Oconnell 02-11-1956 [id number]     Transition of Care Referral  Referral Date: 11/07/2020 Referral Source: [facility] Discharge Report Date of Discharge: 11/06/2020 Facility: [facility] Insurance: [facility]    Outreach attempt # 1 to patient. Main number listed for patient not in service. RN CM attempted alternate number and recording stating "wireless caller not available."    Plan: RN CM will will make outreach attempt to patient within 3-4 business days. RN CM will send unsuccessful outreach letter to patient.   [name], RN,BSN,CCM [facility] Care Management Telephonic Care Management Coordinator Direct Phone: [phone] Toll Free: [phone] Fax: [phone]

## 2020-11-08 ENCOUNTER — Other Ambulatory Visit: Payer: Self-pay

## 2020-11-08 NOTE — Patient Outreach (Signed)
[facility]) Care Management  11/08/2020  Andrea Oconnell [birth year] [id number]    Transition of Care Referral  Referral Date: 11/07/2020 Referral Source: [facility] Discharge Report Date of Discharge: 11/06/2020 Facility: [facility] Insurance: [facility]   Outreach attempt # 2 to patient. Same recording message as previously attempt on yesterday.    Plan: RN CM will make outreach attempt to patient within 3-4 business days.   [name], RN,BSN,CCM [facility] Care Management Telephonic Care Management Coordinator Direct Phone: [phone] Toll Free: [phone] Fax: [phone]

## 2020-11-09 ENCOUNTER — Other Ambulatory Visit: Payer: Self-pay

## 2020-11-09 NOTE — Patient Outreach (Signed)
[facility]) Care Management  11/09/2020  Vlasta LAYLANI PUDWILL 06-Jul-1956 [id number]   Transition of Care Referral  Referral Date:11/07/2020 Referral Source:[name] Date of Discharge:11/06/2020 Facility:[facility] Insurance:[employer] Medicare    Unsuccessful outreach attempt #3 to patient.    Plan: RN CM will make outreach attempt to patient within 3-4 wks if no response from letter mailed to patient.  [name], RN,BSN,CCM [facility] Care Management Telephonic Care Management Coordinator Direct Phone: [phone] Toll Free: [phone] Fax: [phone]

## 2020-11-13 DIAGNOSIS — K5900 Constipation, unspecified: Secondary | ICD-10-CM | POA: Diagnosis not present

## 2020-11-13 DIAGNOSIS — G629 Polyneuropathy, unspecified: Secondary | ICD-10-CM | POA: Diagnosis not present

## 2020-11-13 DIAGNOSIS — E1165 Type 2 diabetes mellitus with hyperglycemia: Secondary | ICD-10-CM | POA: Diagnosis not present

## 2020-11-14 DIAGNOSIS — L89620 Pressure ulcer of left heel, unstageable: Secondary | ICD-10-CM | POA: Diagnosis not present

## 2020-11-21 DIAGNOSIS — R404 Transient alteration of awareness: Secondary | ICD-10-CM | POA: Diagnosis not present

## 2020-11-21 DIAGNOSIS — E11621 Type 2 diabetes mellitus with foot ulcer: Secondary | ICD-10-CM | POA: Diagnosis not present

## 2020-11-21 DIAGNOSIS — I10 Essential (primary) hypertension: Secondary | ICD-10-CM | POA: Diagnosis not present

## 2020-11-21 DIAGNOSIS — R52 Pain, unspecified: Secondary | ICD-10-CM | POA: Diagnosis not present

## 2020-11-21 DIAGNOSIS — A419 Sepsis, unspecified organism: Secondary | ICD-10-CM | POA: Diagnosis not present

## 2020-11-21 DIAGNOSIS — S91309A Unspecified open wound, unspecified foot, initial encounter: Secondary | ICD-10-CM | POA: Diagnosis not present

## 2020-11-21 DIAGNOSIS — R0602 Shortness of breath: Secondary | ICD-10-CM | POA: Diagnosis not present

## 2020-11-21 DIAGNOSIS — K3189 Other diseases of stomach and duodenum: Secondary | ICD-10-CM | POA: Diagnosis not present

## 2020-11-21 DIAGNOSIS — E1165 Type 2 diabetes mellitus with hyperglycemia: Secondary | ICD-10-CM | POA: Diagnosis not present

## 2020-11-21 DIAGNOSIS — R2689 Other abnormalities of gait and mobility: Secondary | ICD-10-CM | POA: Diagnosis not present

## 2020-11-21 DIAGNOSIS — N3090 Cystitis, unspecified without hematuria: Secondary | ICD-10-CM | POA: Diagnosis not present

## 2020-11-21 DIAGNOSIS — E119 Type 2 diabetes mellitus without complications: Secondary | ICD-10-CM | POA: Diagnosis not present

## 2020-11-21 DIAGNOSIS — I440 Atrioventricular block, first degree: Secondary | ICD-10-CM | POA: Diagnosis not present

## 2020-11-21 DIAGNOSIS — R4182 Altered mental status, unspecified: Secondary | ICD-10-CM | POA: Diagnosis not present

## 2020-11-21 DIAGNOSIS — R531 Weakness: Secondary | ICD-10-CM | POA: Diagnosis not present

## 2020-11-21 DIAGNOSIS — E1151 Type 2 diabetes mellitus with diabetic peripheral angiopathy without gangrene: Secondary | ICD-10-CM | POA: Diagnosis not present

## 2020-11-21 DIAGNOSIS — M47817 Spondylosis without myelopathy or radiculopathy, lumbosacral region: Secondary | ICD-10-CM | POA: Diagnosis not present

## 2020-11-21 DIAGNOSIS — K5900 Constipation, unspecified: Secondary | ICD-10-CM | POA: Diagnosis not present

## 2020-11-21 DIAGNOSIS — N3289 Other specified disorders of bladder: Secondary | ICD-10-CM | POA: Diagnosis not present

## 2020-11-21 DIAGNOSIS — I959 Hypotension, unspecified: Secondary | ICD-10-CM | POA: Diagnosis not present

## 2020-11-21 DIAGNOSIS — Z20822 Contact with and (suspected) exposure to covid-19: Secondary | ICD-10-CM | POA: Diagnosis not present

## 2020-11-21 DIAGNOSIS — L97529 Non-pressure chronic ulcer of other part of left foot with unspecified severity: Secondary | ICD-10-CM | POA: Diagnosis not present

## 2020-11-21 DIAGNOSIS — N390 Urinary tract infection, site not specified: Secondary | ICD-10-CM | POA: Diagnosis not present

## 2020-11-21 DIAGNOSIS — Z794 Long term (current) use of insulin: Secondary | ICD-10-CM | POA: Diagnosis not present

## 2020-11-21 DIAGNOSIS — N179 Acute kidney failure, unspecified: Secondary | ICD-10-CM | POA: Diagnosis not present

## 2020-11-21 DIAGNOSIS — N3000 Acute cystitis without hematuria: Secondary | ICD-10-CM | POA: Diagnosis not present

## 2020-11-22 DIAGNOSIS — N3000 Acute cystitis without hematuria: Secondary | ICD-10-CM | POA: Diagnosis not present

## 2020-11-22 DIAGNOSIS — L89629 Pressure ulcer of left heel, unspecified stage: Secondary | ICD-10-CM | POA: Diagnosis not present

## 2020-11-22 DIAGNOSIS — E119 Type 2 diabetes mellitus without complications: Secondary | ICD-10-CM | POA: Diagnosis not present

## 2020-11-22 DIAGNOSIS — I10 Essential (primary) hypertension: Secondary | ICD-10-CM | POA: Diagnosis not present

## 2020-11-22 DIAGNOSIS — K5900 Constipation, unspecified: Secondary | ICD-10-CM | POA: Diagnosis not present

## 2020-11-22 DIAGNOSIS — L89899 Pressure ulcer of other site, unspecified stage: Secondary | ICD-10-CM | POA: Diagnosis not present

## 2020-11-22 DIAGNOSIS — N179 Acute kidney failure, unspecified: Secondary | ICD-10-CM | POA: Diagnosis not present

## 2020-11-22 DIAGNOSIS — S91302A Unspecified open wound, left foot, initial encounter: Secondary | ICD-10-CM | POA: Diagnosis not present

## 2020-11-22 DIAGNOSIS — L89619 Pressure ulcer of right heel, unspecified stage: Secondary | ICD-10-CM | POA: Diagnosis not present

## 2020-11-22 DIAGNOSIS — S91301A Unspecified open wound, right foot, initial encounter: Secondary | ICD-10-CM | POA: Diagnosis not present

## 2020-11-22 DIAGNOSIS — S91309A Unspecified open wound, unspecified foot, initial encounter: Secondary | ICD-10-CM | POA: Diagnosis not present

## 2020-11-23 DIAGNOSIS — I959 Hypotension, unspecified: Secondary | ICD-10-CM | POA: Diagnosis not present

## 2020-11-23 DIAGNOSIS — E1159 Type 2 diabetes mellitus with other circulatory complications: Secondary | ICD-10-CM | POA: Diagnosis not present

## 2020-11-23 DIAGNOSIS — L89619 Pressure ulcer of right heel, unspecified stage: Secondary | ICD-10-CM | POA: Diagnosis not present

## 2020-11-23 DIAGNOSIS — N390 Urinary tract infection, site not specified: Secondary | ICD-10-CM | POA: Diagnosis not present

## 2020-11-23 DIAGNOSIS — L89629 Pressure ulcer of left heel, unspecified stage: Secondary | ICD-10-CM | POA: Diagnosis not present

## 2020-11-23 DIAGNOSIS — L89606 Pressure-induced deep tissue damage of unspecified heel: Secondary | ICD-10-CM | POA: Diagnosis not present

## 2020-11-23 DIAGNOSIS — N3000 Acute cystitis without hematuria: Secondary | ICD-10-CM | POA: Diagnosis not present

## 2020-11-23 DIAGNOSIS — I10 Essential (primary) hypertension: Secondary | ICD-10-CM | POA: Diagnosis not present

## 2020-11-23 DIAGNOSIS — I440 Atrioventricular block, first degree: Secondary | ICD-10-CM | POA: Diagnosis not present

## 2020-11-23 DIAGNOSIS — I739 Peripheral vascular disease, unspecified: Secondary | ICD-10-CM | POA: Diagnosis not present

## 2020-11-23 DIAGNOSIS — K5900 Constipation, unspecified: Secondary | ICD-10-CM | POA: Diagnosis not present

## 2020-11-23 DIAGNOSIS — E119 Type 2 diabetes mellitus without complications: Secondary | ICD-10-CM | POA: Diagnosis not present

## 2020-11-24 DIAGNOSIS — N3000 Acute cystitis without hematuria: Secondary | ICD-10-CM | POA: Diagnosis not present

## 2020-11-24 DIAGNOSIS — E559 Vitamin D deficiency, unspecified: Secondary | ICD-10-CM | POA: Diagnosis not present

## 2020-11-24 DIAGNOSIS — I959 Hypotension, unspecified: Secondary | ICD-10-CM | POA: Diagnosis not present

## 2020-11-24 DIAGNOSIS — I6340 Cerebral infarction due to embolism of unspecified cerebral artery: Secondary | ICD-10-CM | POA: Diagnosis not present

## 2020-11-24 DIAGNOSIS — E785 Hyperlipidemia, unspecified: Secondary | ICD-10-CM | POA: Diagnosis not present

## 2020-11-24 DIAGNOSIS — R5381 Other malaise: Secondary | ICD-10-CM | POA: Diagnosis not present

## 2020-11-24 DIAGNOSIS — E1151 Type 2 diabetes mellitus with diabetic peripheral angiopathy without gangrene: Secondary | ICD-10-CM | POA: Diagnosis not present

## 2020-11-24 DIAGNOSIS — L97529 Non-pressure chronic ulcer of other part of left foot with unspecified severity: Secondary | ICD-10-CM | POA: Diagnosis not present

## 2020-11-24 DIAGNOSIS — R531 Weakness: Secondary | ICD-10-CM | POA: Diagnosis not present

## 2020-11-24 DIAGNOSIS — K5900 Constipation, unspecified: Secondary | ICD-10-CM | POA: Diagnosis not present

## 2020-11-24 DIAGNOSIS — N390 Urinary tract infection, site not specified: Secondary | ICD-10-CM | POA: Diagnosis not present

## 2020-11-24 DIAGNOSIS — M2550 Pain in unspecified joint: Secondary | ICD-10-CM | POA: Diagnosis not present

## 2020-11-24 DIAGNOSIS — L89626 Pressure-induced deep tissue damage of left heel: Secondary | ICD-10-CM | POA: Diagnosis not present

## 2020-11-24 DIAGNOSIS — L89616 Pressure-induced deep tissue damage of right heel: Secondary | ICD-10-CM | POA: Diagnosis not present

## 2020-11-24 DIAGNOSIS — Z7401 Bed confinement status: Secondary | ICD-10-CM | POA: Diagnosis not present

## 2020-11-24 DIAGNOSIS — J020 Streptococcal pharyngitis: Secondary | ICD-10-CM | POA: Diagnosis not present

## 2020-11-24 DIAGNOSIS — L89606 Pressure-induced deep tissue damage of unspecified heel: Secondary | ICD-10-CM | POA: Diagnosis not present

## 2020-11-24 DIAGNOSIS — I10 Essential (primary) hypertension: Secondary | ICD-10-CM | POA: Diagnosis not present

## 2020-11-24 DIAGNOSIS — N3090 Cystitis, unspecified without hematuria: Secondary | ICD-10-CM | POA: Diagnosis not present

## 2020-11-24 DIAGNOSIS — D509 Iron deficiency anemia, unspecified: Secondary | ICD-10-CM | POA: Diagnosis not present

## 2020-11-24 DIAGNOSIS — A419 Sepsis, unspecified organism: Secondary | ICD-10-CM | POA: Diagnosis not present

## 2020-11-24 DIAGNOSIS — E119 Type 2 diabetes mellitus without complications: Secondary | ICD-10-CM | POA: Diagnosis not present

## 2020-11-24 DIAGNOSIS — Z20822 Contact with and (suspected) exposure to covid-19: Secondary | ICD-10-CM | POA: Diagnosis not present

## 2020-11-24 DIAGNOSIS — E1165 Type 2 diabetes mellitus with hyperglycemia: Secondary | ICD-10-CM | POA: Diagnosis not present

## 2020-11-24 DIAGNOSIS — H409 Unspecified glaucoma: Secondary | ICD-10-CM | POA: Diagnosis not present

## 2020-11-24 DIAGNOSIS — R0602 Shortness of breath: Secondary | ICD-10-CM | POA: Diagnosis not present

## 2020-11-24 DIAGNOSIS — R2689 Other abnormalities of gait and mobility: Secondary | ICD-10-CM | POA: Diagnosis not present

## 2020-11-24 DIAGNOSIS — I129 Hypertensive chronic kidney disease with stage 1 through stage 4 chronic kidney disease, or unspecified chronic kidney disease: Secondary | ICD-10-CM | POA: Diagnosis not present

## 2020-11-24 DIAGNOSIS — Z794 Long term (current) use of insulin: Secondary | ICD-10-CM | POA: Diagnosis not present

## 2020-11-24 DIAGNOSIS — I739 Peripheral vascular disease, unspecified: Secondary | ICD-10-CM | POA: Diagnosis not present

## 2020-11-24 DIAGNOSIS — E11621 Type 2 diabetes mellitus with foot ulcer: Secondary | ICD-10-CM | POA: Diagnosis not present

## 2020-11-27 ENCOUNTER — Other Ambulatory Visit: Payer: Self-pay

## 2020-11-27 NOTE — Patient Outreach (Signed)
[facility]) Care Management  11/27/2020  Andrea Oconnell December 05, 1955 [id number]   Transition of Care Referral  Referral Date:11/07/2020 Referral Source:[name] Date of Discharge:11/06/2020 Facility:[facility] Insurance:[employer] Medicare   Multiple attempts to establish contact with patient without success. No response from letter mailed to patient. Case is being closed at this time.    Plan: RN CM will close case at this time.   [name], RN,BSN,CCM [facility] Care Management Telephonic Care Management Coordinator Direct Phone: [phone] Toll Free: [phone] Fax: [phone]

## 2020-11-28 DIAGNOSIS — R5381 Other malaise: Secondary | ICD-10-CM | POA: Diagnosis not present

## 2020-11-28 DIAGNOSIS — L89616 Pressure-induced deep tissue damage of right heel: Secondary | ICD-10-CM | POA: Diagnosis not present

## 2020-11-28 DIAGNOSIS — D509 Iron deficiency anemia, unspecified: Secondary | ICD-10-CM | POA: Diagnosis not present

## 2020-11-28 DIAGNOSIS — E1165 Type 2 diabetes mellitus with hyperglycemia: Secondary | ICD-10-CM | POA: Diagnosis not present

## 2020-11-28 DIAGNOSIS — K5900 Constipation, unspecified: Secondary | ICD-10-CM | POA: Diagnosis not present

## 2020-11-28 DIAGNOSIS — L89626 Pressure-induced deep tissue damage of left heel: Secondary | ICD-10-CM | POA: Diagnosis not present

## 2020-11-28 DIAGNOSIS — I739 Peripheral vascular disease, unspecified: Secondary | ICD-10-CM | POA: Diagnosis not present

## 2020-11-28 DIAGNOSIS — I10 Essential (primary) hypertension: Secondary | ICD-10-CM | POA: Diagnosis not present

## 2020-11-30 ENCOUNTER — Ambulatory Visit: Payer: Self-pay

## 2020-12-12 DIAGNOSIS — I6340 Cerebral infarction due to embolism of unspecified cerebral artery: Secondary | ICD-10-CM | POA: Diagnosis not present

## 2020-12-12 DIAGNOSIS — E559 Vitamin D deficiency, unspecified: Secondary | ICD-10-CM | POA: Diagnosis not present

## 2020-12-12 DIAGNOSIS — I129 Hypertensive chronic kidney disease with stage 1 through stage 4 chronic kidney disease, or unspecified chronic kidney disease: Secondary | ICD-10-CM | POA: Diagnosis not present

## 2020-12-12 DIAGNOSIS — J020 Streptococcal pharyngitis: Secondary | ICD-10-CM | POA: Diagnosis not present

## 2020-12-15 ENCOUNTER — Other Ambulatory Visit: Payer: Self-pay

## 2020-12-15 NOTE — Patient Outreach (Signed)
[facility]) Care Management  12/15/2020  Andrea Oconnell 03/30/1956 [id number]     Transition of Care Referral  Referral Date: 12/15/2020 Referral Source: [facility] Discharge Report Date of Discharge: 12/14/2020 Facility: [facility] Insurance: [facility]    Referral received. Transition of care calls being completed via EMMI-automated calls. RN CM will outreach patient for any red flags received.     Plan: RN CM will close case at this time.    [name], RN,BSN,CCM [facility] Care Management Telephonic Care Management Coordinator Direct Phone: [phone] Toll Free: [phone] Fax: [phone]

## 2021-01-23 DEATH — deceased
